# Patient Record
Sex: Female | Born: 1950 | Race: White | Hispanic: No | Marital: Married | Smoking: Light tobacco smoker
Health system: Southern US, Community
[De-identification: ages and names within clinical notes are randomized; demographics above are authoritative.]

## PROBLEM LIST (undated history)

## (undated) DIAGNOSIS — I161 Hypertensive emergency: Secondary | ICD-10-CM

## (undated) DIAGNOSIS — S62102A Fracture of unspecified carpal bone, left wrist, initial encounter for closed fracture: Secondary | ICD-10-CM

## (undated) DIAGNOSIS — I639 Cerebral infarction, unspecified: Secondary | ICD-10-CM

## (undated) DIAGNOSIS — I6381 Other cerebral infarction due to occlusion or stenosis of small artery: Secondary | ICD-10-CM

## (undated) DIAGNOSIS — I10 Essential (primary) hypertension: Secondary | ICD-10-CM

## (undated) DIAGNOSIS — E785 Hyperlipidemia, unspecified: Secondary | ICD-10-CM

## (undated) HISTORY — DX: Hyperlipidemia, unspecified: E78.5

## (undated) HISTORY — DX: Other cerebral infarction due to occlusion or stenosis of small artery: I63.81

## (undated) HISTORY — DX: Fracture of unspecified carpal bone, left wrist, initial encounter for closed fracture: S62.102A

## (undated) HISTORY — DX: Hypertensive emergency: I16.1

---
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
## 1979-05-08 HISTORY — PX: TUBAL LIGATION: SHX77

## 2018-07-04 ENCOUNTER — Encounter: Payer: Self-pay | Admitting: Emergency Medicine

## 2018-07-04 ENCOUNTER — Other Ambulatory Visit: Payer: Self-pay

## 2018-07-04 ENCOUNTER — Emergency Department
Admission: EM | Admit: 2018-07-04 | Discharge: 2018-07-04 | Disposition: A | Discharge status: Home / Self Care | Payer: Medicare Other | Attending: Emergency Medicine | Admitting: Emergency Medicine

## 2018-07-04 ENCOUNTER — Emergency Department: Payer: Medicare Other

## 2018-07-04 DIAGNOSIS — S6992XA Unspecified injury of left wrist, hand and finger(s), initial encounter: Secondary | ICD-10-CM | POA: Diagnosis present

## 2018-07-04 DIAGNOSIS — W0110XA Fall on same level from slipping, tripping and stumbling with subsequent striking against unspecified object, initial encounter: Secondary | ICD-10-CM | POA: Diagnosis not present

## 2018-07-04 DIAGNOSIS — S60511A Abrasion of right hand, initial encounter: Secondary | ICD-10-CM | POA: Diagnosis not present

## 2018-07-04 DIAGNOSIS — Y92008 Other place in unspecified non-institutional (private) residence as the place of occurrence of the external cause: Secondary | ICD-10-CM | POA: Insufficient documentation

## 2018-07-04 DIAGNOSIS — Y939 Activity, unspecified: Secondary | ICD-10-CM | POA: Diagnosis not present

## 2018-07-04 DIAGNOSIS — S52572A Other intraarticular fracture of lower end of left radius, initial encounter for closed fracture: Secondary | ICD-10-CM | POA: Insufficient documentation

## 2018-07-04 DIAGNOSIS — Y999 Unspecified external cause status: Secondary | ICD-10-CM | POA: Insufficient documentation

## 2018-07-04 DIAGNOSIS — W19XXXA Unspecified fall, initial encounter: Secondary | ICD-10-CM

## 2018-07-04 DIAGNOSIS — S0081XA Abrasion of other part of head, initial encounter: Secondary | ICD-10-CM | POA: Insufficient documentation

## 2018-07-04 MED ORDER — BACITRACIN-NEOMYCIN-POLYMYXIN 400-5-5000 EX OINT
TOPICAL_OINTMENT | Freq: Once | CUTANEOUS | Status: AC
  Administered 2018-07-04: 20:00:00 via TOPICAL
  Filled 2018-07-04: qty 1

## 2018-07-04 MED ORDER — HYDROCODONE-ACETAMINOPHEN 5-325 MG PO TABS: 1.0000 | ORAL_TABLET | Freq: Three times a day (TID) | ORAL | 0 refills | Status: AC | PRN

## 2018-07-04 MED ORDER — HYDROCODONE-ACETAMINOPHEN 5-325 MG PO TABS
1.0000 | ORAL_TABLET | Freq: Once | ORAL | Status: AC
  Administered 2018-07-04: 1 via ORAL
  Filled 2018-07-04: qty 1

## 2018-07-04 NOTE — Discharge Instructions (Addendum)
You were seen today for left wrist pain and swelling. You have a distal radius fracture. We put you in a splint. I have given you a RX for pain medication every 8 hours. Please call [facility] for follow up on Monday

## 2018-07-04 NOTE — ED Provider Notes (Signed)
[facility] Emergency Department Provider Note ____________________________________________  Time seen: 1935  I have reviewed the triage vital signs and the nursing notes.  HISTORY  Chief Complaint  Fall   HPI Jasmine [name] is a 68 y.o. female presents to the ER today with complaint of left wrist pain and swelling.  She reports approximately 1-1/2 hours ago she was walking in the driveway, tripped and fell.  She caught herself with her hands.  She did hit her head on the driveway but did not lose consciousness.  She reports abrasion to the head but is more concerned about the left wrist pain and swelling.  She denies numb numbness or tingling but has had some weakness.  She has not taking anything prior to arrival.  History reviewed. No pertinent past medical history.  There are no active problems to display for this patient.   History reviewed. No pertinent surgical history.  Prior to Admission medications   Medication Sig Start Date End Date Taking? Authorizing Provider  HYDROcodone-acetaminophen (NORCO/VICODIN) 5-325 MG tablet Take 1 tablet by mouth every 8 (eight) hours as needed for moderate pain. 07/04/18 07/04/19  Lorre Munroe, NP    Allergies Patient has no known allergies.  No family history on file.  Social History Social History   Tobacco Use  . Smoking status: Not on file  Substance Use Topics  . Alcohol use: Not on file  . Drug use: Not on file    Review of Systems  Constitutional: Negative for fever. Eyes: Negative for visual changes. Cardiovascular: Negative for chest pain or chest tightness. Respiratory: Negative for shortness of breath. Musculoskeletal: Positive for left wrist pain and swelling.  Negative for left elbow or shoulder pain. Skin: Positive for abrasion to forehead and right palm.. Neurological: Negative for headaches,, dizziness, tingling or numbness. ____________________________________________  PHYSICAL  EXAM:  VITAL SIGNS: ED Triage Vitals  Enc Vitals Group     BP 07/04/18 1852 (!) 233/104     Pulse Rate 07/04/18 1852 70     Resp 07/04/18 1852 18     Temp 07/04/18 1852 97.6 F (36.4 C)     Temp Source 07/04/18 1852 Oral     SpO2 07/04/18 1852 100 %     Weight 07/04/18 1830 108 lb (49 kg)     Height 07/04/18 1830 5\' 3"  (1.6 m)     Head Circumference --      Peak Flow --      Pain Score 07/04/18 1830 8     Pain Loc --      Pain Edu? --      Excl. in GC? --     Constitutional: Alert and oriented. Well appearing and in no distress. Head: Normocephalic.  Abrasion noted to right anterior forehead. Eyes: Conjunctivae are normal. PERRL. Normal extraocular movements Cardiovascular: Normal rate, regular rhythm.  Radial pulses 2+ bilaterally.  Cap refill less than 3 seconds. Respiratory: Normal respiratory effort. No wheezes/rales/rhonchi. Musculoskeletal: Decreased flexion, extension and rotation of the left wrist secondary to pain.  Pain with palpation over the distal radius.  1+ swelling noted of the left wrist.  Normal flexion, extension and rotation of the cervical spine.  No bony tenderness noted over the cervical spine. Neurologic:  Sensation intact to BUE. Skin:  Skin is warm, dry and intact. Abrasion noted to right palm. ____________________________________________  RADIOLOGY   Imaging Orders     DG Wrist Complete Left  IMPRESSION: Acute impacted fracture of the distal radius  with intra-articular Extension.  ____________________________________________  PROCEDURES  .Splint Application Date/Time: 07/04/2018 7:50 PM Performed by: Lorre Munroe, NP Authorized by: Lorre Munroe, NP   Consent:    Consent obtained:  Verbal   Consent given by:  Patient   Risks discussed:  Discoloration, numbness, pain and swelling   Alternatives discussed:  No treatment and delayed treatment Pre-procedure details:    Sensation:  Normal Procedure details:    Laterality:  Left    Location:  Wrist   Wrist:  L wrist   Strapping: no     Cast type:  Short arm   Splint type:  Sugar tong Post-procedure details:    Pain:  Improved   Sensation:  Normal   Patient tolerance of procedure:  Tolerated well, no immediate complications    ____________________________________________  INITIAL IMPRESSION / ASSESSMENT AND PLAN / ED COURSE  Acute Left Wrist Pain and Swelling, Abrasion of Right Hand, Abrasion of Forehead s/p Fall:  Xray c/w impacted distal radius fracture involving the joint Norco 5-325 mg PO x 1 given for pain Wounds cleansed with NS, TAB applied Left wrist placed in short arm sugar tong splint Ok for ortho follow up as outpatient- contact info provided     I reviewed the patient's prescription history over the last 12 months in the multi-state controlled substances database(s) that includes [place], [place], [place], [place], [place], [place], [place], [place], [place], [place], [place], [place], [place], and [place].  Results were notable for no controlled substances filled in the last 6 months. ____________________________________________  FINAL CLINICAL IMPRESSION(S) / ED DIAGNOSES  Final diagnoses:  Other closed intra-articular fracture of distal end of left radius, initial encounter  Fall, initial encounter  Abrasion of palm of right hand, initial encounter  Abrasion of forehead, initial encounter   Nicki Reaper, NP    Lorre Munroe, NP 07/04/18 2001    Dionne Bucy, MD 07/04/18 [phone]

## 2018-07-04 NOTE — ED Triage Notes (Signed)
C/O left wrist pain.  States tripped in the back yard and fell forward onto gravel.  Hit head, no LOC.  Abrasions to left forehead and right palm.  Swelling noted to left wrist.  + radial pulse.. Brisk capillary refill.

## 2018-07-08 DIAGNOSIS — S52502A Unspecified fracture of the lower end of left radius, initial encounter for closed fracture: Secondary | ICD-10-CM | POA: Diagnosis not present

## 2018-07-16 DIAGNOSIS — H2513 Age-related nuclear cataract, bilateral: Secondary | ICD-10-CM | POA: Diagnosis not present

## 2018-07-18 DIAGNOSIS — S52502A Unspecified fracture of the lower end of left radius, initial encounter for closed fracture: Secondary | ICD-10-CM | POA: Diagnosis not present

## 2018-07-28 DIAGNOSIS — S52502A Unspecified fracture of the lower end of left radius, initial encounter for closed fracture: Secondary | ICD-10-CM | POA: Diagnosis not present

## 2018-08-18 DIAGNOSIS — S52502A Unspecified fracture of the lower end of left radius, initial encounter for closed fracture: Secondary | ICD-10-CM | POA: Diagnosis not present

## 2018-08-18 DIAGNOSIS — M79632 Pain in left forearm: Secondary | ICD-10-CM | POA: Diagnosis not present

## 2018-09-08 DIAGNOSIS — S52502A Unspecified fracture of the lower end of left radius, initial encounter for closed fracture: Secondary | ICD-10-CM | POA: Diagnosis not present

## 2019-07-17 ENCOUNTER — Other Ambulatory Visit: Payer: Self-pay

## 2019-07-17 ENCOUNTER — Emergency Department (HOSPITAL_COMMUNITY): Payer: Medicare Other

## 2019-07-17 ENCOUNTER — Inpatient Hospital Stay (HOSPITAL_COMMUNITY): Payer: Medicare Other

## 2019-07-17 ENCOUNTER — Encounter (HOSPITAL_COMMUNITY): Payer: Self-pay | Admitting: Emergency Medicine

## 2019-07-17 ENCOUNTER — Inpatient Hospital Stay (HOSPITAL_COMMUNITY)
Admission: EM | Admit: 2019-07-17 | Discharge: 2019-07-21 | DRG: 305 | Disposition: A | Discharge status: Home / Self Care | Payer: Medicare Other | Attending: Pulmonary Disease | Admitting: Pulmonary Disease

## 2019-07-17 DIAGNOSIS — Z79899 Other long term (current) drug therapy: Secondary | ICD-10-CM | POA: Diagnosis not present

## 2019-07-17 DIAGNOSIS — Z9114 Patient's other noncompliance with medication regimen: Secondary | ICD-10-CM | POA: Diagnosis not present

## 2019-07-17 DIAGNOSIS — E876 Hypokalemia: Secondary | ICD-10-CM | POA: Diagnosis present

## 2019-07-17 DIAGNOSIS — R531 Weakness: Secondary | ICD-10-CM | POA: Diagnosis not present

## 2019-07-17 DIAGNOSIS — Z743 Need for continuous supervision: Secondary | ICD-10-CM | POA: Diagnosis not present

## 2019-07-17 DIAGNOSIS — Z7982 Long term (current) use of aspirin: Secondary | ICD-10-CM | POA: Diagnosis not present

## 2019-07-17 DIAGNOSIS — I6381 Other cerebral infarction due to occlusion or stenosis of small artery: Secondary | ICD-10-CM

## 2019-07-17 DIAGNOSIS — Z8673 Personal history of transient ischemic attack (TIA), and cerebral infarction without residual deficits: Secondary | ICD-10-CM

## 2019-07-17 DIAGNOSIS — R0902 Hypoxemia: Secondary | ICD-10-CM | POA: Diagnosis not present

## 2019-07-17 DIAGNOSIS — Z791 Long term (current) use of non-steroidal anti-inflammatories (NSAID): Secondary | ICD-10-CM

## 2019-07-17 DIAGNOSIS — E7800 Pure hypercholesterolemia, unspecified: Secondary | ICD-10-CM | POA: Diagnosis not present

## 2019-07-17 DIAGNOSIS — I161 Hypertensive emergency: Principal | ICD-10-CM | POA: Diagnosis present

## 2019-07-17 DIAGNOSIS — I674 Hypertensive encephalopathy: Secondary | ICD-10-CM | POA: Diagnosis present

## 2019-07-17 DIAGNOSIS — I6389 Other cerebral infarction: Secondary | ICD-10-CM | POA: Diagnosis not present

## 2019-07-17 DIAGNOSIS — F419 Anxiety disorder, unspecified: Secondary | ICD-10-CM | POA: Diagnosis present

## 2019-07-17 DIAGNOSIS — E785 Hyperlipidemia, unspecified: Secondary | ICD-10-CM | POA: Diagnosis present

## 2019-07-17 DIAGNOSIS — I10 Essential (primary) hypertension: Secondary | ICD-10-CM | POA: Diagnosis present

## 2019-07-17 DIAGNOSIS — Z20822 Contact with and (suspected) exposure to covid-19: Secondary | ICD-10-CM | POA: Diagnosis not present

## 2019-07-17 DIAGNOSIS — I639 Cerebral infarction, unspecified: Secondary | ICD-10-CM | POA: Diagnosis not present

## 2019-07-17 DIAGNOSIS — I6523 Occlusion and stenosis of bilateral carotid arteries: Secondary | ICD-10-CM | POA: Diagnosis not present

## 2019-07-17 DIAGNOSIS — F17200 Nicotine dependence, unspecified, uncomplicated: Secondary | ICD-10-CM | POA: Diagnosis not present

## 2019-07-17 DIAGNOSIS — R29810 Facial weakness: Secondary | ICD-10-CM | POA: Diagnosis not present

## 2019-07-17 DIAGNOSIS — F17210 Nicotine dependence, cigarettes, uncomplicated: Secondary | ICD-10-CM | POA: Diagnosis present

## 2019-07-17 HISTORY — DX: Essential (primary) hypertension: I10

## 2019-07-17 LAB — RESPIRATORY PANEL BY RT PCR (FLU A&B, COVID)
Influenza A by PCR: NEGATIVE
Influenza B by PCR: NEGATIVE
SARS Coronavirus 2 by RT PCR: NEGATIVE

## 2019-07-17 LAB — CBC WITH DIFFERENTIAL/PLATELET
Abs Immature Granulocytes: 0.04 10*3/uL (ref 0.00–0.07)
Basophils Absolute: 0.1 10*3/uL (ref 0.0–0.1)
Basophils Relative: 1 %
Eosinophils Absolute: 0.1 10*3/uL (ref 0.0–0.5)
Eosinophils Relative: 1 %
HCT: 44.9 % (ref 36.0–46.0)
Hemoglobin: 14.7 g/dL (ref 12.0–15.0)
Immature Granulocytes: 0 %
Lymphocytes Relative: 26 %
Lymphs Abs: 2.7 10*3/uL (ref 0.7–4.0)
MCH: 29.3 pg (ref 26.0–34.0)
MCHC: 32.7 g/dL (ref 30.0–36.0)
MCV: 89.4 fL (ref 80.0–100.0)
Monocytes Absolute: 0.7 10*3/uL (ref 0.1–1.0)
Monocytes Relative: 7 %
Neutro Abs: 7 10*3/uL (ref 1.7–7.7)
Neutrophils Relative %: 65 %
Platelets: 261 10*3/uL (ref 150–400)
RBC: 5.02 MIL/uL (ref 3.87–5.11)
RDW: 12.6 % (ref 11.5–15.5)
WBC: 10.7 10*3/uL — ABNORMAL HIGH (ref 4.0–10.5)
nRBC: 0 % (ref 0.0–0.2)

## 2019-07-17 LAB — BASIC METABOLIC PANEL
Anion gap: 9 (ref 5–15)
BUN: <5 mg/dL — ABNORMAL LOW (ref 8–23)
CO2: 26 mmol/L (ref 22–32)
Calcium: 9.7 mg/dL (ref 8.9–10.3)
Chloride: 108 mmol/L (ref 98–111)
Creatinine, Ser: 0.64 mg/dL (ref 0.44–1.00)
GFR calc Af Amer: >60 mL/min (ref >60)
GFR calc non Af Amer: >60 mL/min (ref >60)
Glucose, Bld: 83 mg/dL (ref 70–99)
Potassium: 3.5 mmol/L (ref 3.5–5.1)
Sodium: 143 mmol/L (ref 135–145)

## 2019-07-17 LAB — URINALYSIS, ROUTINE W REFLEX MICROSCOPIC
Bilirubin Urine: NEGATIVE
Glucose, UA: NEGATIVE mg/dL
Hgb urine dipstick: NEGATIVE
Ketones, ur: NEGATIVE mg/dL
Leukocytes,Ua: NEGATIVE
Nitrite: NEGATIVE
Protein, ur: NEGATIVE mg/dL
Specific Gravity, Urine: 1.001 — ABNORMAL LOW (ref 1.005–1.030)
pH: 7 (ref 5.0–8.0)

## 2019-07-17 LAB — I-STAT CHEM 8, ED
BUN: 4 mg/dL — ABNORMAL LOW (ref 8–23)
Calcium, Ion: 1.27 mmol/L (ref 1.15–1.40)
Chloride: 107 mmol/L (ref 98–111)
Creatinine, Ser: 0.6 mg/dL (ref 0.44–1.00)
Glucose, Bld: 78 mg/dL (ref 70–99)
HCT: 41 % (ref 36.0–46.0)
Hemoglobin: 13.9 g/dL (ref 12.0–15.0)
Potassium: 3.4 mmol/L — ABNORMAL LOW (ref 3.5–5.1)
Sodium: 143 mmol/L (ref 135–145)
TCO2: 28 mmol/L (ref 22–32)

## 2019-07-17 LAB — RAPID URINE DRUG SCREEN, HOSP PERFORMED
Amphetamines: NOT DETECTED
Barbiturates: NOT DETECTED
Benzodiazepines: POSITIVE — AB
Cocaine: NOT DETECTED
Opiates: NOT DETECTED
Tetrahydrocannabinol: NOT DETECTED

## 2019-07-17 LAB — MRSA PCR SCREENING: MRSA by PCR: NEGATIVE

## 2019-07-17 LAB — ETHANOL: Alcohol, Ethyl (B): <10 mg/dL (ref <10)

## 2019-07-17 LAB — HIV ANTIBODY (ROUTINE TESTING W REFLEX): HIV Screen 4th Generation wRfx: NONREACTIVE

## 2019-07-17 LAB — APTT: aPTT: 28 seconds (ref 24–36)

## 2019-07-17 LAB — PROTIME-INR
INR: 0.9 (ref 0.8–1.2)
Prothrombin Time: 12.5 seconds (ref 11.4–15.2)

## 2019-07-17 LAB — BRAIN NATRIURETIC PEPTIDE: B Natriuretic Peptide: 140.7 pg/mL — ABNORMAL HIGH (ref 0.0–100.0)

## 2019-07-17 LAB — CBG MONITORING, ED: Glucose-Capillary: 88 mg/dL (ref 70–99)

## 2019-07-17 MED ORDER — HYDRALAZINE HCL 50 MG PO TABS
50.0000 mg | ORAL_TABLET | Freq: Four times a day (QID) | ORAL | Status: DC | PRN
  Administered 2019-07-17: 50 mg via ORAL
  Filled 2019-07-17: qty 1

## 2019-07-17 MED ORDER — IOHEXOL 350 MG/ML SOLN
75.0000 mL | Freq: Once | INTRAVENOUS | Status: AC | PRN
  Administered 2019-07-17: 75 mL via INTRAVENOUS

## 2019-07-17 MED ORDER — LABETALOL HCL 5 MG/ML IV SOLN
20.0000 mg | Freq: Once | INTRAVENOUS | Status: AC
  Administered 2019-07-17: 20 mg via INTRAVENOUS
  Filled 2019-07-17: qty 4

## 2019-07-17 MED ORDER — CHLORHEXIDINE GLUCONATE CLOTH 2 % EX PADS
6.0000 | MEDICATED_PAD | Freq: Every day | CUTANEOUS | Status: DC
  Administered 2019-07-17 – 2019-07-20 (×4): 6 via TOPICAL

## 2019-07-17 MED ORDER — VITAMIN D 25 MCG (1000 UNIT) PO TABS
1000.0000 [IU] | ORAL_TABLET | Freq: Every day | ORAL | Status: DC
  Administered 2019-07-18 – 2019-07-21 (×4): 1000 [IU] via ORAL
  Filled 2019-07-17 (×4): qty 1

## 2019-07-17 MED ORDER — SODIUM CHLORIDE 0.9% FLUSH: 3.0000 mL | Freq: Once | INTRAVENOUS | Status: DC

## 2019-07-17 MED ORDER — POTASSIUM CHLORIDE CRYS ER 20 MEQ PO TBCR: 40.0000 meq | EXTENDED_RELEASE_TABLET | Freq: Once | ORAL | Status: DC

## 2019-07-17 MED ORDER — HEPARIN SODIUM (PORCINE) 5000 UNIT/ML IJ SOLN
5000.0000 [IU] | Freq: Three times a day (TID) | INTRAMUSCULAR | Status: DC
  Administered 2019-07-18 – 2019-07-20 (×9): 5000 [IU] via SUBCUTANEOUS
  Filled 2019-07-17 (×11): qty 1

## 2019-07-17 MED ORDER — SODIUM CHLORIDE 0.9 % IV SOLN: INTRAVENOUS | Status: DC

## 2019-07-17 MED ORDER — CLEVIDIPINE BUTYRATE 0.5 MG/ML IV EMUL
0.0000 mg/h | INTRAVENOUS | Status: DC
  Administered 2019-07-17: 2 mg/h via INTRAVENOUS
  Administered 2019-07-18: 15 mg/h via INTRAVENOUS
  Administered 2019-07-18: 8 mg/h via INTRAVENOUS
  Administered 2019-07-18: 12 mg/h via INTRAVENOUS
  Administered 2019-07-19: 8 mg/h via INTRAVENOUS
  Administered 2019-07-19: 10 mg/h via INTRAVENOUS
  Filled 2019-07-17 (×10): qty 50

## 2019-07-17 MED ORDER — CLEVIDIPINE BUTYRATE 0.5 MG/ML IV EMUL
0.0000 mg/h | INTRAVENOUS | Status: DC
  Administered 2019-07-17: 1 mg/h via INTRAVENOUS
  Filled 2019-07-17: qty 50

## 2019-07-17 NOTE — Consult Note (Signed)
NEURO HOSPITALIST CONSULT NOTE   Requestig physician: Dr. Rubin Payor  Reason for Consult: Acute onset of stuttering TIA symptoms  History obtained from:  Patient and Chart     HPI:                                                                                                                                          Jasmine [name] is a 69 y.o. female with a history of HTN presenting to the ED after waking up at 7:30 AM with LUE and LLE weakness. She took a full dose of ASA and the symptoms resolved by 8 AM. The patient then had another spell of the same symptoms lasting from 10 to 11 AM; she also noticed difficulty picking up her phone at that time. She was able to call EMS and was transported to the ED at [facility], arriving at about 11:30 AM. Had endorsed numbness of the left leg below the knee with EMS. When EDP examined the patient, her BP was noted to be 230/108; at that time (11:55 AM) symptoms consistent with a third spell occurred and Code Stroke was called. The symptoms rapidly resolved by 12:05 PM. She was taken directly to CT and a CTA head/neck was also obtained. No hemorrhage or acute hypodensity was seen on CT. No LVO was appreciated on CTA. When she was brought back to her room, BP continued to be elevated at 229/89.    Past Medical History:  Diagnosis Date  . Hypertension     Past Surgical History:  Procedure Laterality Date  . TUBAL LIGATION  1981    No family history on file.            Social History:  reports that she has been smoking cigarettes. She has been smoking about 1.00 pack per day. She has never used smokeless tobacco. She reports previous alcohol use. She reports previous drug use.  No Known Allergies  HOME MEDICATIONS:                                                                                                                      No current facility-administered medications on file prior to encounter.   Current Outpatient  Medications on File Prior to Encounter  Medication Sig Dispense  Refill  . aspirin EC 325 MG tablet Take 325 mg by mouth once.    . Black Elderberry 50 MG/5ML SYRP Take 15 mLs by mouth daily.    . cholecalciferol (VITAMIN D3) 25 MCG (1000 UNIT) tablet Take 1,000 Units by mouth daily.    Marland Kitchen. ibuprofen (ADVIL) 200 MG tablet Take 400 mg by mouth every 6 (six) hours as needed for headache or moderate pain.    . Multiple Vitamins-Minerals (MULTIVITAMIN WITH MINERALS) tablet Take 1 tablet by mouth daily.    . Probiotic Product (PROBIOTIC-10 ULTIMATE) CAPS Take 1 capsule by mouth daily.      ROS:                                                                                                                                       No SOB, CP, abdominal pain, abnormal sweating, back pain, headache, speech deficit or confusion. Does not endorse B/B incontinence, seizure activity or vision changes.   Blood pressure (!) 211/97, pulse 67, temperature 98 F (36.7 C), temperature source Oral, resp. rate 20, height 5\' 1"  (1.549 m), weight 48.6 kg, SpO2 98 %.   General Examination:                                                                                                       Physical Exam  HEENT-  [place]/AT    Lungs- Respirations unlabored Extremities- No edema  Neurological Examination Mental Status: Alert, fully oriented, thought content appropriate.  Speech fluent without evidence of aphasia.  Able to follow all commands without difficulty. Cranial Nerves: II: Visual fields intact with no extinction to DSS. Fixates and tracks normally.   III,IV, VI: EOMI without nystagmus. No ptosis.  V,VII: Smile symmetric, facial temp sensation normal bilaterally VIII: hearing intact to voice IX,X: No hypophonia or hoarseness XI: Symmetric XII: Midline tongue extension Motor: 4+/5 left hip flexion. Otherwise 5/5 in all 4 extremities.  Normal tone throughout; no atrophy noted Sensory: Temp and light touch  intact throughout, bilaterally. No extinction to DSS.  Deep Tendon Reflexes:  3+ bilateral brachioradialis 2+ right biceps, 3+ left biceps 3+ right patellar, 4+ left patellar (crossed adductor response) 2+ right achilles, 3+ left achilles Right toe downgoing, left toe upgoing.  Cerebellar: Action tremor with LUE FNF, otherwise normal.  Gait: Deferred   Lab Results: Basic Metabolic Panel: Recent Labs  Lab 07/17/19 1204  NA 143  K 3.4*  CL 107  GLUCOSE 78  BUN 4*  CREATININE 0.60    CBC: Recent Labs  Lab 07/17/19 1141 07/17/19 1204  WBC 10.7*  --   NEUTROABS 7.0  --   HGB 14.7 13.9  HCT 44.9 41.0  MCV 89.4  --   PLT 261  --     Cardiac Enzymes: No results for input(s): CKTOTAL, CKMB, CKMBINDEX, TROPONINI in the last 168 hours.  Lipid Panel: No results for input(s): CHOL, TRIG, HDL, CHOLHDL, VLDL, LDLCALC in the last 168 hours.  Imaging: CT Angio Head W or Wo Contrast  Result Date: 07/17/2019 CLINICAL DATA:  Intermittent left-sided numbness in weakness beginning 0730 hours EXAM: CT ANGIOGRAPHY HEAD AND NECK TECHNIQUE: Multidetector CT imaging of the head and neck was performed using the standard protocol during bolus administration of intravenous contrast. Multiplanar CT image reconstructions and MIPs were obtained to evaluate the vascular anatomy. Carotid stenosis measurements (when applicable) are obtained utilizing NASCET criteria, using the distal internal carotid diameter as the denominator. CONTRAST:  58mL OMNIPAQUE IOHEXOL 350 MG/ML SOLN COMPARISON:  None. FINDINGS: CT HEAD FINDINGS Brain: The brain shows a normal appearance without evidence of malformation, atrophy, acute small or large vessel infarction, mass lesion, hemorrhage, hydrocephalus or extra-axial collection. Mild chronic small-vessel change of the white matter. 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 Vascular: No hyperdense vessel. No evidence of  atherosclerotic calcification. Skull: Normal.  No traumatic finding.  No focal bone lesion. Sinuses/Orbits: Sinuses are clear. Orbits appear normal. Mastoids are clear. Other: None significant Aspects score: 10 CTA NECK FINDINGS Aortic arch: Aortic atherosclerosis. Branching pattern is normal without origin stenosis. Right carotid system: Common carotid artery widely patent to the bifurcation. Scattered plaque formation but no stenosis. Carotid bifurcation shows soft and calcified plaque but there is no stenosis compared to the more distal cervical ICA diameter. Left carotid system: Common carotid artery shows scattered plaque but is widely patent to the bifurcation. Soft and calcified plaque at the carotid bifurcation and ICA bulb but no stenosis compared to the more distal cervical ICA diameter. Vertebral arteries: Early origin of the right vertebral artery. Widely patent. Left vertebral artery is normal. Skeleton: Ordinary cervical spondylosis. Other neck: No mass or lymphadenopathy. Upper chest: Emphysema and pulmonary scarring.  No active process. Review of the MIP images confirms the above findings CTA HEAD FINDINGS Anterior circulation: Both internal carotid arteries widely patent through the skull base and siphon region. Ordinary siphon atherosclerotic calcification but no stenosis greater than 30%. The anterior and middle cerebral vessels are patent without proximal stenosis, aneurysm or vascular malformation. No large or medium vessel occlusion identified. Posterior circulation: Both vertebral arteries widely patent to the basilar. No basilar stenosis. Posterior circulation branch vessels are normal. Venous sinuses: Patent and normal. Anatomic variants: None significant. Review of the MIP images confirms the above findings IMPRESSION: Head CT: No acute finding. Aspects 10. 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  CT angiography of the neck: Aortic atherosclerosis and emphysema. Atherosclerotic plaque affecting both common carotid arteries but without measurable stenosis. Soft and calcified plaque at both carotid bifurcations and ICA bulbs but without measurable stenosis. No posterior circulation pathology. CT angiography of the head: No intracranial large or medium vessel occlusion. No correctable proximal stenosis. These results were communicated to Dr. Cheral Marker at 12:33 pmon 3/12/2021by text page via the [facility] messaging system. Electronically Signed   By: Nelson Chimes M.D.   On: 07/17/2019 12:36  CT Angio Neck W and/or Wo Contrast  Result Date: 07/17/2019 CLINICAL DATA:  Intermittent left-sided numbness in weakness beginning 0730 hours EXAM: CT ANGIOGRAPHY HEAD AND NECK TECHNIQUE: Multidetector CT imaging of the head and neck was performed using the standard protocol during bolus administration of intravenous contrast. Multiplanar CT image reconstructions and MIPs were obtained to evaluate the vascular anatomy. Carotid stenosis measurements (when applicable) are obtained utilizing NASCET criteria, using the distal internal carotid diameter as the denominator. CONTRAST:  64mL OMNIPAQUE IOHEXOL 350 MG/ML SOLN COMPARISON:  None. FINDINGS: CT HEAD FINDINGS Brain: The brain shows a normal appearance without evidence of malformation, atrophy, acute small or large vessel infarction, mass lesion, hemorrhage, hydrocephalus or extra-axial collection. Mild chronic small-vessel change of the white matter. 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 Vascular: No hyperdense vessel. No evidence of atherosclerotic calcification. Skull: Normal.  No traumatic finding.  No focal bone lesion. Sinuses/Orbits: Sinuses are clear. Orbits appear normal. Mastoids are clear. Other: None significant Aspects score: 10 CTA NECK FINDINGS Aortic arch: Aortic atherosclerosis. Branching pattern is normal  without origin stenosis. Right carotid system: Common carotid artery widely patent to the bifurcation. Scattered plaque formation but no stenosis. Carotid bifurcation shows soft and calcified plaque but there is no stenosis compared to the more distal cervical ICA diameter. Left carotid system: Common carotid artery shows scattered plaque but is widely patent to the bifurcation. Soft and calcified plaque at the carotid bifurcation and ICA bulb but no stenosis compared to the more distal cervical ICA diameter. Vertebral arteries: Early origin of the right vertebral artery. Widely patent. Left vertebral artery is normal. Skeleton: Ordinary cervical spondylosis. Other neck: No mass or lymphadenopathy. Upper chest: Emphysema and pulmonary scarring.  No active process. Review of the MIP images confirms the above findings CTA HEAD FINDINGS Anterior circulation: Both internal carotid arteries widely patent through the skull base and siphon region. Ordinary siphon atherosclerotic calcification but no stenosis greater than 30%. The anterior and middle cerebral vessels are patent without proximal stenosis, aneurysm or vascular malformation. No large or medium vessel occlusion identified. Posterior circulation: Both vertebral arteries widely patent to the basilar. No basilar stenosis. Posterior circulation branch vessels are normal. Venous sinuses: Patent and normal. Anatomic variants: None significant. Review of the MIP images confirms the above findings IMPRESSION: Head CT: No acute finding. Aspects 10. 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 CT angiography of the neck: Aortic atherosclerosis and emphysema. Atherosclerotic plaque affecting both common carotid arteries but without measurable stenosis. Soft and calcified plaque at both carotid bifurcations and ICA bulbs but without measurable stenosis. No posterior circulation  pathology. CT angiography of the head: No intracranial large or medium vessel occlusion. No correctable proximal stenosis. These results were communicated to Dr. Otelia Limes at 12:33 pmon 3/12/2021by text page via the [facility] messaging system. Electronically Signed   By: Paulina Fusi M.D.   On: 07/17/2019 12:36   CT HEAD CODE STROKE WO CONTRAST  Result Date: 07/17/2019 CLINICAL DATA:  Intermittent left-sided numbness in weakness beginning 0730 hours EXAM: CT ANGIOGRAPHY HEAD AND NECK TECHNIQUE: Multidetector CT imaging of the head and neck was performed using the standard protocol during bolus administration of intravenous contrast. Multiplanar CT image reconstructions and MIPs were obtained to evaluate the vascular anatomy. Carotid stenosis measurements (when applicable) are obtained utilizing NASCET criteria, using the distal internal carotid diameter as the denominator. CONTRAST:  74mL OMNIPAQUE IOHEXOL 350 MG/ML SOLN COMPARISON:  None. FINDINGS: CT HEAD FINDINGS Brain: The brain shows a normal appearance without evidence of malformation, atrophy, acute small or large vessel infarction, mass lesion, hemorrhage, hydrocephalus or extra-axial collection. Mild chronic small-vessel change of the white matter. 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 Vascular: No hyperdense vessel. No evidence of atherosclerotic calcification. Skull: Normal.  No traumatic finding.  No focal bone lesion. Sinuses/Orbits: Sinuses are clear. Orbits appear normal. Mastoids are clear. Other: None significant Aspects score: 10 CTA NECK FINDINGS Aortic arch: Aortic atherosclerosis. Branching pattern is normal without origin stenosis. Right carotid system: Common carotid artery widely patent to the bifurcation. Scattered plaque formation but no stenosis. Carotid bifurcation shows soft and calcified plaque but there is no stenosis compared to the more distal cervical ICA diameter. Left carotid system:  Common carotid artery shows scattered plaque but is widely patent to the bifurcation. Soft and calcified plaque at the carotid bifurcation and ICA bulb but no stenosis compared to the more distal cervical ICA diameter. Vertebral arteries: Early origin of the right vertebral artery. Widely patent. Left vertebral artery is normal. Skeleton: Ordinary cervical spondylosis. Other neck: No mass or lymphadenopathy. Upper chest: Emphysema and pulmonary scarring.  No active process. Review of the MIP images confirms the above findings CTA HEAD FINDINGS Anterior circulation: Both internal carotid arteries widely patent through the skull base and siphon region. Ordinary siphon atherosclerotic calcification but no stenosis greater than 30%. The anterior and middle cerebral vessels are patent without proximal stenosis, aneurysm or vascular malformation. No large or medium vessel occlusion identified. Posterior circulation: Both vertebral arteries widely patent to the basilar. No basilar stenosis. Posterior circulation branch vessels are normal. Venous sinuses: Patent and normal. Anatomic variants: None significant. Review of the MIP images confirms the above findings IMPRESSION: Head CT: No acute finding. Aspects 10. 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 CT angiography of the neck: Aortic atherosclerosis and emphysema. Atherosclerotic plaque affecting both common carotid arteries but without measurable stenosis. Soft and calcified plaque at both carotid bifurcations and ICA bulbs but without measurable stenosis. No posterior circulation pathology. CT angiography of the head: No intracranial large or medium vessel occlusion. No correctable proximal stenosis. These results were communicated to Dr. Otelia Limes at 12:33 pmon 3/12/2021by text page via the [facility] messaging system. Electronically Signed   By: Paulina Fusi M.D.   On: 07/17/2019  12:36    Assessment: 69 year old female presenting with stuttering TIA symptoms manifesting as intermittent left sided weakness.  1. Neurological exam reveals subtle left hip flexion weakness and asymmetric reflexes, greater on the left.   2. CT head is negative for acute hemorrhage. There are old small vessel infarctions in the left basal ganglia/external capsule and mild small vessel ischemic changes of the hemispheric white matter.  3. No LVO on CTA of head and neck. Mild atherosclerosis is noted.  4. Overall presentation is most consistent with hypertensive encephalopathy with associated vasospasm resulting in TIA symptoms.    Recommendations:  1. Aggressive BP management. Goal SBP of 130-150 2. MRI brain.  3. Frequent neuro checks .  Electronically signed: Dr. Caryl Pina 07/17/2019, 12:42 PM

## 2019-07-17 NOTE — Code Documentation (Signed)
69 yo female coming from home where she reports waking up with left sided heaviness and numbess. Pt took an aspirin and at approximatel [phone] the issue completely resolved. Pt reports being completely back to baseline. At 1000, the symptoms returned and the pt stated her husband called EMS. EMS evaluated the patient and encourage her to come to the hospital. Before transport to the hospital, symptoms fully resolved.   When EDP examined patient, BP noted to be 230/108 and heaviness on the left side returned. Code Stroke called at 1158. When this RN arrived at 1200, the patient was back to baseline - NIHSS 0. Taken directly to CT. CTA completed. Not a candidate for tPA due to symptoms resolved. No large vessel occlusion found on CTA.   Taken back to room and placed on the cardiac monitor. Repeat BP 229/89. No symptoms at this time. NIHSS 0. Handoff given to [place], Charity fundraiser. TIA alert. Q2 Hour VS and mNIHSS.

## 2019-07-17 NOTE — Progress Notes (Signed)
eLink Physician-Brief Progress Note Patient Name: Jasmine [name] DOB: [birth year] MRN: [id number]   Date of Service  07/17/2019  HPI/Events of Note  Cleviprex IV infiltrated. Not able to get another IV. Request for PO antihypertensive.   eICU Interventions  Will order: 1. Hydralazine 50 mg PO Q 6 hours PRN SBP > 160.     Intervention Category Major Interventions: Hypertension - evaluation and management  Ahyana Skillin Eugene 07/17/2019, 11:10 PM

## 2019-07-17 NOTE — ED Notes (Signed)
ED Provider at bedside. 

## 2019-07-17 NOTE — ED Notes (Signed)
Dr. Delton Coombes ED Provider at bedside.

## 2019-07-17 NOTE — ED Provider Notes (Signed)
[facility] EMERGENCY DEPARTMENT Provider Note   CSN: [id number] Arrival date & time: 07/17/19  1123     History Chief Complaint  Patient presents with  . Weakness    Jasmine [name] is a 69 y.o. female.  HPI Patient presents with difficulty moving the left side.  Last normal at 1130 last night.Woke up with weakness on the left side.  Had difficulty getting out of bed.  Took a full dose aspirin and states that about half hour later her left arm and left leg went back to normal.  At around 10 AM deficits returned.  In between this she had been driving and states that deficits had completely resolved.  Around 30 minutes had resolution of symptoms.  No headache.  History of hypertension but not currently on any medications.     Past Medical History:  Diagnosis Date  . Hypertension     There are no problems to display for this patient.   Past Surgical History:  Procedure Laterality Date  . TUBAL LIGATION  1981     OB History   No obstetric history on file.     No family history on file.  Social History   Tobacco Use  . Smoking status: Current Every Day Smoker    Packs/day: 1.00    Types: Cigarettes  . Smokeless tobacco: Never Used  Substance Use Topics  . Alcohol use: Not Currently  . Drug use: Not Currently    Home Medications Prior to Admission medications   Not on File    Allergies    Patient has no known allergies.  Review of Systems   Review of Systems  Constitutional: Negative for diaphoresis.  HENT: Negative for congestion.   Respiratory: Negative for shortness of breath.   Gastrointestinal: Negative for abdominal pain.  Genitourinary: Negative for flank pain.  Musculoskeletal: Negative for back pain.  Neurological: Positive for weakness. Negative for headaches.  Psychiatric/Behavioral: Negative for confusion.    Physical Exam Updated Vital Signs BP (!) 211/97 (BP Location: Right Arm)   Pulse 67   Temp 98 F (36.7 C)  (Oral)   Resp 20   Ht 5\' 1"  (1.549 m)   Wt 48.6 kg   SpO2 98%   BMI 20.24 kg/m   Physical Exam Vitals and nursing note reviewed.  HENT:     Head: Normocephalic.  Eyes:     Extraocular Movements: Extraocular movements intact.     Pupils: Pupils are equal, round, and reactive to light.  Cardiovascular:     Rate and Rhythm: Regular rhythm.  Pulmonary:     Breath sounds: No wheezing or rhonchi.  Abdominal:     Tenderness: There is no abdominal tenderness.  Musculoskeletal:        General: No tenderness.     Cervical back: Neck supple.  Skin:    General: Skin is warm.  Neurological:     Mental Status: She is alert.     Comments: Face symmetric.  Eye movements intact.  Visual fields grossly intact.  However some weakness on left upper and lower extremity compared to right.  Mild.  No left-sided neglect.  Face symmetric.  Psychiatric:        Mood and Affect: Mood normal.     ED Results / Procedures / Treatments   Labs (all labs ordered are listed, but only abnormal results are displayed) Labs Reviewed  BASIC METABOLIC PANEL - Abnormal; Notable for the following components:      Result  Value   BUN <5 (*)    All other components within normal limits  URINALYSIS, ROUTINE W REFLEX MICROSCOPIC - Abnormal; Notable for the following components:   Color, Urine COLORLESS (*)    Specific Gravity, Urine 1.001 (*)    All other components within normal limits  CBC WITH DIFFERENTIAL/PLATELET - Abnormal; Notable for the following components:   WBC 10.7 (*)    All other components within normal limits  I-STAT CHEM 8, ED - Abnormal; Notable for the following components:   Potassium 3.4 (*)    BUN 4 (*)    All other components within normal limits  RESPIRATORY PANEL BY RT PCR (FLU A&B, COVID)  PROTIME-INR  APTT  ETHANOL  RAPID URINE DRUG SCREEN, HOSP PERFORMED  CBG MONITORING, ED    EKG EKG Interpretation  Date/Time:  Friday July 17 2019 11:34:59 EST Ventricular Rate:  72 PR  Interval:    QRS Duration: 90 QT Interval:  413 QTC Calculation: 452 R Axis:   85 Text Interpretation: Sinus rhythm Ventricular premature complex Borderline right axis deviation Left ventricular hypertrophy Abnormal T, consider ischemia, lateral leads Confirmed by Benjiman CorePickering, Kaylei Frink [phone]([id number]) on 07/17/2019 12:53:50 PM   Radiology CT Angio Head W or Wo Contrast  Result Date: 07/17/2019 CLINICAL DATA:  Intermittent left-sided numbness in weakness beginning 0730 hours EXAM: CT ANGIOGRAPHY HEAD AND NECK TECHNIQUE: Multidetector CT imaging of the head and neck was performed using the standard protocol during bolus administration of intravenous contrast. Multiplanar CT image reconstructions and MIPs were obtained to evaluate the vascular anatomy. Carotid stenosis measurements (when applicable) are obtained utilizing NASCET criteria, using the distal internal carotid diameter as the denominator. CONTRAST:  75mL OMNIPAQUE IOHEXOL 350 MG/ML SOLN COMPARISON:  None. FINDINGS: CT HEAD FINDINGS Brain: The brain shows a normal appearance without evidence of malformation, atrophy, acute small or large vessel infarction, mass lesion, hemorrhage, hydrocephalus or extra-axial collection. Mild chronic small-vessel change of the white matter. 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 Vascular: No hyperdense vessel. No evidence of atherosclerotic calcification. Skull: Normal.  No traumatic finding.  No focal bone lesion. Sinuses/Orbits: Sinuses are clear. Orbits appear normal. Mastoids are clear. Other: None significant Aspects score: 10 CTA NECK FINDINGS Aortic arch: Aortic atherosclerosis. Branching pattern is normal without origin stenosis. Right carotid system: Common carotid artery widely patent to the bifurcation. Scattered plaque formation but no stenosis. Carotid bifurcation shows soft and calcified plaque but there is no stenosis compared to the more distal cervical ICA  diameter. Left carotid system: Common carotid artery shows scattered plaque but is widely patent to the bifurcation. Soft and calcified plaque at the carotid bifurcation and ICA bulb but no stenosis compared to the more distal cervical ICA diameter. Vertebral arteries: Early origin of the right vertebral artery. Widely patent. Left vertebral artery is normal. Skeleton: Ordinary cervical spondylosis. Other neck: No mass or lymphadenopathy. Upper chest: Emphysema and pulmonary scarring.  No active process. Review of the MIP images confirms the above findings CTA HEAD FINDINGS Anterior circulation: Both internal carotid arteries widely patent through the skull base and siphon region. Ordinary siphon atherosclerotic calcification but no stenosis greater than 30%. The anterior and middle cerebral vessels are patent without proximal stenosis, aneurysm or vascular malformation. No large or medium vessel occlusion identified. Posterior circulation: Both vertebral arteries widely patent to the basilar. No basilar stenosis. Posterior circulation branch vessels are normal. Venous sinuses: Patent and normal. Anatomic variants: None significant. Review of the  MIP images confirms the above findings IMPRESSION: Head CT: No acute finding. Aspects 10. 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 CT angiography of the neck: Aortic atherosclerosis and emphysema. Atherosclerotic plaque affecting both common carotid arteries but without measurable stenosis. Soft and calcified plaque at both carotid bifurcations and ICA bulbs but without measurable stenosis. No posterior circulation pathology. CT angiography of the head: No intracranial large or medium vessel occlusion. No correctable proximal stenosis. These results were communicated to Dr. Cheral Marker at 12:33 pmon 3/12/2021by text page via the [facility] messaging system. Electronically Signed   By: Nelson Chimes M.D.   On: 07/17/2019 12:36   CT Angio Neck W and/or Wo Contrast  Result Date: 07/17/2019 CLINICAL DATA:  Intermittent left-sided numbness in weakness beginning 0730 hours EXAM: CT ANGIOGRAPHY HEAD AND NECK TECHNIQUE: Multidetector CT imaging of the head and neck was performed using the standard protocol during bolus administration of intravenous contrast. Multiplanar CT image reconstructions and MIPs were obtained to evaluate the vascular anatomy. Carotid stenosis measurements (when applicable) are obtained utilizing NASCET criteria, using the distal internal carotid diameter as the denominator. CONTRAST:  77mL OMNIPAQUE IOHEXOL 350 MG/ML SOLN COMPARISON:  None. FINDINGS: CT HEAD FINDINGS Brain: The brain shows a normal appearance without evidence of malformation, atrophy, acute small or large vessel infarction, mass lesion, hemorrhage, hydrocephalus or extra-axial collection. Mild chronic small-vessel change of the white matter. 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 Vascular: No hyperdense vessel. No evidence of atherosclerotic calcification. Skull: Normal.  No traumatic finding.  No focal bone lesion. Sinuses/Orbits: Sinuses are clear. Orbits appear normal. Mastoids are clear. Other: None significant Aspects score: 10 CTA NECK FINDINGS Aortic arch: Aortic atherosclerosis. Branching pattern is normal without origin stenosis. Right carotid system: Common carotid artery widely patent to the bifurcation. Scattered plaque formation but no stenosis. Carotid bifurcation shows soft and calcified plaque but there is no stenosis compared to the more distal cervical ICA diameter. Left carotid system: Common carotid artery shows scattered plaque but is widely patent to the bifurcation. Soft and calcified plaque at the carotid bifurcation and ICA bulb but no stenosis compared to the more distal cervical ICA diameter. Vertebral arteries: Early origin of the right  vertebral artery. Widely patent. Left vertebral artery is normal. Skeleton: Ordinary cervical spondylosis. Other neck: No mass or lymphadenopathy. Upper chest: Emphysema and pulmonary scarring.  No active process. Review of the MIP images confirms the above findings CTA HEAD FINDINGS Anterior circulation: Both internal carotid arteries widely patent through the skull base and siphon region. Ordinary siphon atherosclerotic calcification but no stenosis greater than 30%. The anterior and middle cerebral vessels are patent without proximal stenosis, aneurysm or vascular malformation. No large or medium vessel occlusion identified. Posterior circulation: Both vertebral arteries widely patent to the basilar. No basilar stenosis. Posterior circulation branch vessels are normal. Venous sinuses: Patent and normal. Anatomic variants: None significant. Review of the MIP images confirms the above findings IMPRESSION: Head CT: No acute finding. Aspects 10. 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 CT angiography of the neck: Aortic atherosclerosis and emphysema. Atherosclerotic plaque affecting both common carotid arteries but without measurable stenosis. Soft and calcified plaque at both carotid bifurcations and ICA bulbs but without measurable stenosis. No posterior circulation pathology. CT angiography of the head: No intracranial large or  medium vessel occlusion. No correctable proximal stenosis. These results were communicated to Dr. Otelia Limes at 12:33 pmon 3/12/2021by text page via the [facility] messaging system. Electronically Signed   By: Paulina Fusi M.D.   On: 07/17/2019 12:36   CT HEAD CODE STROKE WO CONTRAST  Result Date: 07/17/2019 CLINICAL DATA:  Intermittent left-sided numbness in weakness beginning 0730 hours EXAM: CT ANGIOGRAPHY HEAD AND NECK TECHNIQUE: Multidetector CT imaging of the head and neck was performed using the  standard protocol during bolus administration of intravenous contrast. Multiplanar CT image reconstructions and MIPs were obtained to evaluate the vascular anatomy. Carotid stenosis measurements (when applicable) are obtained utilizing NASCET criteria, using the distal internal carotid diameter as the denominator. CONTRAST:  32mL OMNIPAQUE IOHEXOL 350 MG/ML SOLN COMPARISON:  None. FINDINGS: CT HEAD FINDINGS Brain: The brain shows a normal appearance without evidence of malformation, atrophy, acute small or large vessel infarction, mass lesion, hemorrhage, hydrocephalus or extra-axial collection. Mild chronic small-vessel change of the white matter. 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 Vascular: No hyperdense vessel. No evidence of atherosclerotic calcification. Skull: Normal.  No traumatic finding.  No focal bone lesion. Sinuses/Orbits: Sinuses are clear. Orbits appear normal. Mastoids are clear. Other: None significant Aspects score: 10 CTA NECK FINDINGS Aortic arch: Aortic atherosclerosis. Branching pattern is normal without origin stenosis. Right carotid system: Common carotid artery widely patent to the bifurcation. Scattered plaque formation but no stenosis. Carotid bifurcation shows soft and calcified plaque but there is no stenosis compared to the more distal cervical ICA diameter. Left carotid system: Common carotid artery shows scattered plaque but is widely patent to the bifurcation. Soft and calcified plaque at the carotid bifurcation and ICA bulb but no stenosis compared to the more distal cervical ICA diameter. Vertebral arteries: Early origin of the right vertebral artery. Widely patent. Left vertebral artery is normal. Skeleton: Ordinary cervical spondylosis. Other neck: No mass or lymphadenopathy. Upper chest: Emphysema and pulmonary scarring.  No active process. Review of the MIP images confirms the above findings CTA HEAD FINDINGS Anterior  circulation: Both internal carotid arteries widely patent through the skull base and siphon region. Ordinary siphon atherosclerotic calcification but no stenosis greater than 30%. The anterior and middle cerebral vessels are patent without proximal stenosis, aneurysm or vascular malformation. No large or medium vessel occlusion identified. Posterior circulation: Both vertebral arteries widely patent to the basilar. No basilar stenosis. Posterior circulation branch vessels are normal. Venous sinuses: Patent and normal. Anatomic variants: None significant. Review of the MIP images confirms the above findings IMPRESSION: Head CT: No acute finding. Aspects 10. 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 CT angiography of the neck: Aortic atherosclerosis and emphysema. Atherosclerotic plaque affecting both common carotid arteries but without measurable stenosis. Soft and calcified plaque at both carotid bifurcations and ICA bulbs but without measurable stenosis. No posterior circulation pathology. CT angiography of the head: No intracranial large or medium vessel occlusion. No correctable proximal stenosis. These results were communicated to Dr. Otelia Limes at 12:33 pmon 3/12/2021by text page via the [facility] messaging system. Electronically Signed   By: Paulina Fusi M.D.   On: 07/17/2019 12:36    Procedures Procedures (including critical care time)  Medications Ordered in ED Medications  sodium chloride flush (NS) 0.9 % injection 3 mL (has no administration in time range)  labetalol (NORMODYNE) injection 20 mg (has no administration in time range)  And  clevidipine (CLEVIPREX) infusion 0.5 mg/mL (has no administration in time range)  iohexol (OMNIPAQUE) 350 MG/ML injection 75 mL (75 mLs Intravenous Contrast Given 07/17/19 1222)    ED Course  I have reviewed the triage vital signs and the nursing notes.  Pertinent labs &  imaging results that were available during my care of the patient were reviewed by me and considered in my medical decision making (see chart for details).    MDM Rules/Calculators/A&P                      Patient with focal neuro deficits.  Stuttering.  However had been normal at 10 AM.  Code stroke called due to some deficits on my initial exam.  By the time neurology examined symptoms had resolved.  Head CT done and reassuring.  Severely hypertensive with pressure up to 230 systolic.  Off her medicines.  Neurology believes this is hypertensive emergency.  Will benefit from aggressive blood pressure control.  IV antihypertensive started.  Will admit to ICU.  CRITICAL CARE Performed by: Benjiman Core Total critical care time: 30 minutes Critical care time was exclusive of separately billable procedures and treating other patients. Critical care was necessary to treat or prevent imminent or life-threatening deterioration. Critical care was time spent personally by me on the following activities: development of treatment plan with patient and/or surrogate as well as nursing, discussions with consultants, evaluation of patient's response to treatment, examination of patient, obtaining history from patient or surrogate, ordering and performing treatments and interventions, ordering and review of laboratory studies, ordering and review of radiographic studies, pulse oximetry and re-evaluation of patient's condition.  Final Clinical Impression(s) / ED Diagnoses Final diagnoses:  Hypertensive emergency    Rx / DC Orders ED Discharge Orders    None       Benjiman Core, MD 07/17/19 1254

## 2019-07-17 NOTE — ED Notes (Signed)
Dr. Otelia Limes ED Provider at bedside.

## 2019-07-17 NOTE — ED Notes (Signed)
EDP notified of pts complaints and blood pressure.

## 2019-07-17 NOTE — ED Triage Notes (Signed)
Patient presents to the ED by EMS with c/o heaviness on the left side, left leg dragging and couldn't pick up her phone at 2300. She went to breakfast this morning and had a return or the heaviness. Numbness on left leg below the knee with EMS all symptoms have resolved prior to arrival. Off all prescriptive meds. LVO=0 200/102

## 2019-07-17 NOTE — H&P (Signed)
NAME:  Jasmine [name], MRN:  [id number], DOB:  June 07, 1950, LOS: 0 ADMISSION DATE:  07/17/2019, CONSULTATION DATE:  3/12 REFERRING MD:  pickering, CHIEF COMPLAINT:  Hypertensive crisis    Brief History   69 year old white female admitted 3/12 with hypertensive emergency with associated neurological deficits including left-sided weakness and slurred speech  History of present illness   This is a 69 year old white female with history as mentioned below presents to the emergency room with acute onset of left-sided weakness, dysarthria, and status post fall.  Was in usual state of health up until this morning when the symptoms occurred, she first noted them when she was trying to get out of bed.  The symptoms would resolve spontaneously, then recur, because of this she presented to the emergency room.  In the ER On initial evaluation blood pressure was 200/102, CT of head was negative for acute findings however there was mention of old small vessel infarct in the left basal ganglia/external capsule.  Neurology was consulted and felt constellation of findings were more consistent with hypertensive encephalopathy and recommended strict blood pressure control critical care asked to admit Denies: Headache, fevers, sore throat, chest pain, shortness of breath, wheezing, dizziness, loss of strength prior to this morning, speech change prior to this morning, falls prior to this morning.  Denies nausea vomiting or diarrhea.  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 rate 14, height 5\' 1"  (1.549 m), weight 48.6 kg, SpO2 96 %.       No intake or output data in the 24 hours ending 07/17/19 1317 Filed Weights   07/17/19 1133 07/17/19 1158  Weight: 48.1 kg 48.6 kg    Examination: General: 69 year old white female currently resting in bed she is in no acute distress HENT: Normocephalic atraumatic no jugular venous distention pupils equal reactive to light speech slightly slurred Lungs: Clear to auscultation without accessory use Cardiovascular: Regular rate and rhythm no murmur rub or gallop appreciated Abdomen: Soft nontender Extremities: Warm dry brisk cap refill no edema Neuro: Awake oriented no focal deficits appreciated currently both extremities equal GU: Due to void  Resolved Hospital Problem list     Assessment & Plan:  Hypertensive crisis/hypertensive emergency with neurological deficits -These deficits wax and wane depending on blood pressure -highest recorded systolic 230 about 2 hrs ago -uds only + for benzos (she has script for) Plan We will admit to ICU Will shoot for SBP goal < 170 and DBP goal < 100 over the next 23 hrs then aim towards 120-140 goal after that; starting Cleviprex infusion; initiate oral therapy in am   Serial neuro checks Am chemistry Consider CT angiogram to r/o RAS at some point  Tobacco abuse Plan Smoking cessation recommended   Hypokalemia Plan Replace and recheck   H/o anxiety Plan Cont PRN xanax   Best practice:  Diet: heart healthy  Pain/Anxiety/Delirium protocol (if indicated): NA VAP protocol (if indicated): NA DVT prophylaxis: heparin [place] GI prophylaxis: NA Glucose control: NA Mobility: w/ assist  Code Status: full code  Family  Communication: pending  Disposition: ICU admit   Labs   CBC: Recent Labs  Lab 07/17/19 1141 07/17/19 1204  WBC 10.7*  --   NEUTROABS 7.0  --   HGB 14.7 13.9  HCT 44.9 41.0  MCV 89.4  --   PLT 261  --     Basic Metabolic Panel: Recent Labs  Lab 07/17/19 1141 07/17/19 1204  NA 143 143  K 3.5 3.4*  CL 108 107  CO2 26  --   GLUCOSE 83 78  BUN <5* 4*  CREATININE 0.64 0.60  CALCIUM 9.7  --    GFR: Estimated Creatinine Clearance: 51.5 mL/min (by C-G formula based on SCr of 0.6 mg/dL). Recent Labs  Lab 07/17/19 1141  WBC 10.7*    Liver Function Tests: No results for input(s): AST, ALT, ALKPHOS, BILITOT, PROT, ALBUMIN in the last 168 hours. No results for input(s): LIPASE, AMYLASE in the last 168 hours. No results for input(s): AMMONIA in the last 168 hours.  ABG    Component Value Date/Time   TCO2 28 07/17/2019 1204     Coagulation Profile: Recent Labs  Lab 07/17/19 1154  INR 0.9    Cardiac Enzymes: No results for input(s): CKTOTAL, CKMB, CKMBINDEX, TROPONINI in the last 168 hours.  HbA1C: No results found for: HGBA1C  CBG: Recent Labs  Lab 07/17/19 1142  GLUCAP 88    Review of Systems:   Review of Systems  Constitutional: Negative.   HENT: Negative.   Eyes: Negative.   Respiratory: Negative.   Gastrointestinal: Negative.   Genitourinary: Negative.   Musculoskeletal: Negative.   Skin: Negative.   Neurological: Positive for dizziness, sensory change, focal weakness and weakness.  Endo/Heme/Allergies: Negative.   Psychiatric/Behavioral: Negative.      Past Medical History  She,  has a past medical history of Hypertension.   Surgical History    Past Surgical History:  Procedure Laterality Date  . TUBAL LIGATION  1981     Social History   reports that she has been smoking cigarettes. She has been smoking about 1.00 pack per day. She has never used smokeless tobacco. She reports previous alcohol use. She reports previous drug use.    Family History   Her family history is not on file.   Allergies No Known Allergies   Home Medications  Prior to Admission medications   Not on File     Critical care time: 33 minutes    Erick Colace ACNP-BC [place] Pager # [phone] OR # [phone] if no answer

## 2019-07-18 ENCOUNTER — Inpatient Hospital Stay (HOSPITAL_COMMUNITY): Payer: Medicare Other

## 2019-07-18 DIAGNOSIS — I6389 Other cerebral infarction: Secondary | ICD-10-CM

## 2019-07-18 LAB — BASIC METABOLIC PANEL
Anion gap: 9 (ref 5–15)
BUN: 5 mg/dL — ABNORMAL LOW (ref 8–23)
CO2: 21 mmol/L — ABNORMAL LOW (ref 22–32)
Calcium: 9.5 mg/dL (ref 8.9–10.3)
Chloride: 110 mmol/L (ref 98–111)
Creatinine, Ser: 0.54 mg/dL (ref 0.44–1.00)
GFR calc Af Amer: >60 mL/min (ref >60)
GFR calc non Af Amer: >60 mL/min (ref >60)
Glucose, Bld: 119 mg/dL — ABNORMAL HIGH (ref 70–99)
Potassium: 3.2 mmol/L — ABNORMAL LOW (ref 3.5–5.1)
Sodium: 140 mmol/L (ref 135–145)

## 2019-07-18 LAB — CBC
HCT: 43 % (ref 36.0–46.0)
Hemoglobin: 14.8 g/dL (ref 12.0–15.0)
MCH: 29.8 pg (ref 26.0–34.0)
MCHC: 34.4 g/dL (ref 30.0–36.0)
MCV: 86.5 fL (ref 80.0–100.0)
Platelets: 281 10*3/uL (ref 150–400)
RBC: 4.97 MIL/uL (ref 3.87–5.11)
RDW: 12.8 % (ref 11.5–15.5)
WBC: 13.8 10*3/uL — ABNORMAL HIGH (ref 4.0–10.5)
nRBC: 0 % (ref 0.0–0.2)

## 2019-07-18 LAB — ECHOCARDIOGRAM COMPLETE
Height: 61 in
Weight: 1713.6 oz

## 2019-07-18 LAB — MAGNESIUM: Magnesium: 1.9 mg/dL (ref 1.7–2.4)

## 2019-07-18 LAB — PHOSPHORUS: Phosphorus: 2.5 mg/dL (ref 2.5–4.6)

## 2019-07-18 MED ORDER — ASPIRIN 325 MG PO TABS
325.0000 mg | ORAL_TABLET | Freq: Once | ORAL | Status: AC
  Administered 2019-07-18: 325 mg via ORAL
  Filled 2019-07-18: qty 1

## 2019-07-18 MED ORDER — LORAZEPAM 2 MG/ML IJ SOLN
1.0000 mg | Freq: Once | INTRAMUSCULAR | Status: AC
  Administered 2019-07-18: 1 mg via INTRAVENOUS
  Filled 2019-07-18: qty 1

## 2019-07-18 MED ORDER — AMLODIPINE BESYLATE 5 MG PO TABS
5.0000 mg | ORAL_TABLET | Freq: Every day | ORAL | Status: DC
  Administered 2019-07-18 – 2019-07-19 (×2): 5 mg via ORAL
  Filled 2019-07-18 (×2): qty 1

## 2019-07-18 MED ORDER — ALPRAZOLAM 0.25 MG PO TABS
0.2500 mg | ORAL_TABLET | Freq: Once | ORAL | Status: AC | PRN
  Administered 2019-07-18: 0.25 mg via ORAL
  Filled 2019-07-18: qty 1

## 2019-07-18 MED ORDER — POTASSIUM CHLORIDE CRYS ER 20 MEQ PO TBCR
40.0000 meq | EXTENDED_RELEASE_TABLET | Freq: Once | ORAL | Status: AC
  Administered 2019-07-18: 40 meq via ORAL
  Filled 2019-07-18: qty 2

## 2019-07-18 MED ORDER — HYDROCHLOROTHIAZIDE 12.5 MG PO CAPS
12.5000 mg | ORAL_CAPSULE | Freq: Every day | ORAL | Status: DC
  Filled 2019-07-18: qty 1

## 2019-07-18 MED ORDER — HYDRALAZINE HCL 20 MG/ML IJ SOLN: 5.0000 mg | Freq: Four times a day (QID) | INTRAMUSCULAR | Status: DC | PRN

## 2019-07-18 MED ORDER — ASPIRIN 325 MG PO TABS
325.0000 mg | ORAL_TABLET | Freq: Every day | ORAL | Status: DC
  Administered 2019-07-19: 325 mg via ORAL
  Filled 2019-07-18: qty 1

## 2019-07-18 MED ORDER — HYDRALAZINE HCL 50 MG PO TABS
50.0000 mg | ORAL_TABLET | Freq: Four times a day (QID) | ORAL | Status: DC | PRN
  Administered 2019-07-18 – 2019-07-19 (×5): 50 mg via ORAL
  Filled 2019-07-18 (×5): qty 1

## 2019-07-18 NOTE — Progress Notes (Signed)
eLink Physician-Brief Progress Note Patient Name: Jasmine [name] DOB: [birth year] MRN: [id number]   Date of Service  07/18/2019  HPI/Events of Note  Asking for IV hydralazine, Xanax as at home. Discussed with bed side RN.  Neuro notes seen, said to start asa 325 mg od as at home, SBP 130 to 150 goal. On Clevedepine gtt, oral hydralaizne already. Received amlodepine earlier.  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 The acute stroke is in a small vessel distribution. 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   eICU Interventions  - IV hydralazine prn for SBP > 180. - call back if SBP > 160. - xanax oral once and asa 325 mg once ordered. Bed side to contact Dr Wilford Corner Neuro for asa clarification and SBP goals.         Ranee Gosselin 07/18/2019, 7:57 PM

## 2019-07-18 NOTE — Progress Notes (Signed)
Pt complaining of tingling sensation to the left arm, NIH remains 0. Dr. Wilford Corner notified.

## 2019-07-18 NOTE — Progress Notes (Signed)
  Echocardiogram 2D Echocardiogram has been performed.  [name] 07/18/2019, 8:51 AM

## 2019-07-18 NOTE — Progress Notes (Signed)
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 The acute stroke is in a small vessel distribution. 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  Marland Kitchen amLODipine  5 mg Oral Daily  . Chlorhexidine Gluconate Cloth  6 each Topical Daily  . cholecalciferol  1,000 Units Oral Daily  . heparin  5,000 Units Subcutaneous Q8H  Clevidipine gtt   Recommendations:  -- The acute stroke is not likely to have a significant ischemic penumbra due to its small size.  -- Continue BP management with Clevidipine with SBP goal of 130-150 -- Stroke team to see in the AM.   -- Restart home ASA 325 mg po qd  Electronically signed: Dr. Caryl Pina

## 2019-07-18 NOTE — Progress Notes (Signed)
NAME:  Jasmine [name], MRN:  [id number], DOB:  Jun 26, 1950, LOS: 1 ADMISSION DATE:  07/17/2019, CONSULTATION DATE:  3/12 REFERRING MD:  pickering, CHIEF COMPLAINT:  Hypertensive crisis    Brief History   69 year old white female admitted 3/12 with hypertensive emergency with associated neurological deficits including left-sided weakness and slurred speech  History of present illness   This is a 69 year old white female with history as mentioned below presents to the emergency room with acute onset of left-sided weakness, dysarthria, and status post fall.  Was in usual state of health up until this morning when the symptoms occurred, she first noted them when she was trying to get out of bed.  The symptoms would resolve spontaneously, then recur, because of this she presented to the emergency room.  In the ER On initial evaluation blood pressure was 200/102, CT of head was negative for acute findings however there was mention of old small vessel infarct in the left basal ganglia/external capsule.  Neurology was consulted and felt constellation of findings were more consistent with hypertensive encephalopathy and recommended strict blood pressure control critical care asked to admit Denies: Headache, fevers, sore throat, chest pain, shortness of breath, wheezing, dizziness, loss of strength prior to this morning, speech change prior to this morning, falls prior to this morning.  Denies nausea vomiting or diarrhea.  Past Medical History  HTN, anxiety, tobacco abuse  Significant Hospital Events   3/12: Admitted for hypertensive emergency, started on Cleviprex infusion  Consults:  Neurology consulted in the emergency room  Procedures:    Significant Diagnostic Tests:  CT brain, and CT angiogram 3/12:, Old small vessel infarct in the left basal ganglia/external capsule and mild small vessel changes in the hemispheric white matter  Micro Data:    Antimicrobials:     Interim  history/subjective:  No complaints this morning. Remains on cleviprex. MRI scheduled this am. Given ativan for anxiety prior to procedure  Objective   Blood pressure (!) 125/94, pulse 86, temperature 98.3 F (36.8 C), temperature source Oral, resp. rate 15, height 5\' 1"  (1.549 m), weight 48.6 kg, SpO2 95 %.        Intake/Output Summary (Last 24 hours) at 07/18/2019 0944 Last data filed at 07/18/2019 0700 Gross per 24 hour  Intake 1911.04 ml  Output 3575 ml  Net -1663.96 ml   Filed Weights   07/17/19 1133 07/17/19 1158  Weight: 48.1 kg 48.6 kg   Physical Exam: General: Well-appearing, no acute distress HENT: [place], AT, OP clear, MMM Eyes: EOMI, no scleral icterus Respiratory: Clear to auscultation bilaterally.  No crackles, wheezing or rales Cardiovascular: RRR, -M/R/G, no JVD GI: BS+, soft, nontender Extremities:-Edema,-tenderness Neuro: AAO x4, CNII-XII grossly intact Skin: Intact, no rashes or bruising Psych: Normal mood, normal affect  Resolved Hospital Problem list     Assessment & Plan:   Hypertensive crisis/hypertensive emergency with neurological deficits -These deficits wax and wane depending on blood pressure -highest recorded systolic 086 about 2 hrs ago -uds only + for benzos (she has script for) Plan Wean Cleviprex Start amlodipine 5 mg Serial neuro checks  Tobacco abuse Plan Smoking cessation recommended   Hypokalemia Plan Replete  H/o anxiety Plan Cont PRN xanax  Ativan OTO prior to brain imaging  Discussed plan with RN and pharmacy team  Best practice:  Diet: heart healthy Pain/Anxiety/Delirium protocol (if indicated): NA VAP protocol (if indicated): NA DVT prophylaxis: heparin [place] GI prophylaxis: NA Glucose control: NA Mobility: w/ assist  Code Status: full code  Family Communication: Updated patient at bedside on 3/13 Disposition: ICU  Labs   CBC: Recent Labs  Lab 07/17/19 1141 07/17/19 1204 07/18/19 0352  WBC 10.7*  --  13.8*   NEUTROABS 7.0  --   --   HGB 14.7 13.9 14.8  HCT 44.9 41.0 43.0  MCV 89.4  --  86.5  PLT 261  --  281    Basic Metabolic Panel: Recent Labs  Lab 07/17/19 1141 07/17/19 1204 07/18/19 0352  NA 143 143 140  K 3.5 3.4* 3.2*  CL 108 107 110  CO2 26  --  21*  GLUCOSE 83 78 119*  BUN <5* 4* 5*  CREATININE 0.64 0.60 0.54  CALCIUM 9.7  --  9.5  MG  --   --  1.9  PHOS  --   --  2.5   GFR: Estimated Creatinine Clearance: 51.5 mL/min (by C-G formula based on SCr of 0.54 mg/dL). Recent Labs  Lab 07/17/19 1141 07/18/19 0352  WBC 10.7* 13.8*    Liver Function Tests: No results for input(s): AST, ALT, ALKPHOS, BILITOT, PROT, ALBUMIN in the last 168 hours. No results for input(s): LIPASE, AMYLASE in the last 168 hours. No results for input(s): AMMONIA in the last 168 hours.  ABG    Component Value Date/Time   TCO2 28 07/17/2019 1204     Coagulation Profile: Recent Labs  Lab 07/17/19 1154  INR 0.9    Cardiac Enzymes: No results for input(s): CKTOTAL, CKMB, CKMBINDEX, TROPONINI in the last 168 hours.  HbA1C: No results found for: HGBA1C  CBG: Recent Labs  Lab 07/17/19 1142  GLUCAP 88    The patient is critically ill with multiple organ systems failure and requires high complexity decision making for assessment and support, frequent evaluation and titration of therapies, application of advanced monitoring technologies and extensive interpretation of multiple databases.   Critical Care Time devoted to patient care services described in this note is 40 Minutes.   Mechele Collin, M.D. [facility] Pulmonary/Critical Care Medicine 07/18/2019 9:44 AM   Please see Amion for pager number to reach on-call Pulmonary and Critical Care Team.

## 2019-07-19 DIAGNOSIS — I639 Cerebral infarction, unspecified: Secondary | ICD-10-CM

## 2019-07-19 LAB — BASIC METABOLIC PANEL
Anion gap: 10 (ref 5–15)
BUN: <5 mg/dL — ABNORMAL LOW (ref 8–23)
CO2: 21 mmol/L — ABNORMAL LOW (ref 22–32)
Calcium: 9.7 mg/dL (ref 8.9–10.3)
Chloride: 104 mmol/L (ref 98–111)
Creatinine, Ser: 0.43 mg/dL — ABNORMAL LOW (ref 0.44–1.00)
GFR calc Af Amer: >60 mL/min (ref >60)
GFR calc non Af Amer: >60 mL/min (ref >60)
Glucose, Bld: 133 mg/dL — ABNORMAL HIGH (ref 70–99)
Potassium: 4 mmol/L (ref 3.5–5.1)
Sodium: 135 mmol/L (ref 135–145)

## 2019-07-19 LAB — LIPID PANEL
Cholesterol: 235 mg/dL — ABNORMAL HIGH (ref 0–200)
HDL: 49 mg/dL (ref >40)
LDL Cholesterol: 158 mg/dL — ABNORMAL HIGH (ref 0–99)
Total CHOL/HDL Ratio: 4.8 RATIO
Triglycerides: 138 mg/dL (ref <150)
VLDL: 28 mg/dL (ref 0–40)

## 2019-07-19 LAB — TRIGLYCERIDES: Triglycerides: 141 mg/dL (ref <150)

## 2019-07-19 LAB — HEMOGLOBIN A1C
Hgb A1c MFr Bld: 5.3 % (ref 4.8–5.6)
Mean Plasma Glucose: 105.41 mg/dL

## 2019-07-19 MED ORDER — ATORVASTATIN CALCIUM 80 MG PO TABS
80.0000 mg | ORAL_TABLET | Freq: Every day | ORAL | Status: DC
  Administered 2019-07-20: 80 mg via ORAL
  Filled 2019-07-19: qty 1

## 2019-07-19 MED ORDER — HYDROCHLOROTHIAZIDE 12.5 MG PO CAPS
12.5000 mg | ORAL_CAPSULE | Freq: Every day | ORAL | Status: DC
  Administered 2019-07-19: 12.5 mg via ORAL

## 2019-07-19 MED ORDER — CLOPIDOGREL BISULFATE 75 MG PO TABS
75.0000 mg | ORAL_TABLET | Freq: Every day | ORAL | Status: DC
  Administered 2019-07-19 – 2019-07-21 (×3): 75 mg via ORAL
  Filled 2019-07-19 (×3): qty 1

## 2019-07-19 MED ORDER — AMLODIPINE BESYLATE 10 MG PO TABS
10.0000 mg | ORAL_TABLET | Freq: Every day | ORAL | Status: DC
  Administered 2019-07-20 – 2019-07-21 (×2): 10 mg via ORAL
  Filled 2019-07-19 (×2): qty 1

## 2019-07-19 MED ORDER — ASPIRIN EC 81 MG PO TBEC
81.0000 mg | DELAYED_RELEASE_TABLET | Freq: Every day | ORAL | Status: DC
  Administered 2019-07-20 – 2019-07-21 (×2): 81 mg via ORAL
  Filled 2019-07-19 (×2): qty 1

## 2019-07-19 MED ORDER — BENAZEPRIL HCL 5 MG PO TABS
10.0000 mg | ORAL_TABLET | Freq: Every day | ORAL | Status: DC
  Administered 2019-07-19: 10 mg via ORAL
  Filled 2019-07-19: qty 2

## 2019-07-19 MED ORDER — HYDROCHLOROTHIAZIDE 12.5 MG PO CAPS
12.5000 mg | ORAL_CAPSULE | Freq: Once | ORAL | Status: AC
  Administered 2019-07-19: 12.5 mg via ORAL
  Filled 2019-07-19: qty 1

## 2019-07-19 MED ORDER — HYDROCHLOROTHIAZIDE 25 MG PO TABS
25.0000 mg | ORAL_TABLET | Freq: Every day | ORAL | Status: DC
  Administered 2019-07-20 – 2019-07-21 (×2): 25 mg via ORAL
  Filled 2019-07-19 (×2): qty 1

## 2019-07-19 MED ORDER — LORAZEPAM 0.5 MG PO TABS
0.5000 mg | ORAL_TABLET | Freq: Three times a day (TID) | ORAL | Status: DC
  Administered 2019-07-19 – 2019-07-20 (×2): 0.5 mg via ORAL
  Filled 2019-07-19 (×2): qty 1

## 2019-07-19 NOTE — Plan of Care (Signed)
Called by the RN regarding blood pressure parameters for this patient. Small vessel stroke and hypertensive emergency. Goal blood pressures yesterday were recommended to be 1 30-1 50 but I think she can be gradually brought down and for now I will put the goal blood pressures to try to maintain them below 160 systolic. I have given her an additional 12.5 of hydrochlorothiazide. I have also added standing doses of Ativan-she is on home Xanax which has not been resumed during the hospital stay. Benzo withdrawal might be contributing to her high pressures as well. CCM team to reassess and optimize antihypertensives in the morning. From a stroke standpoint, does not need permissive hypertension anymore and blood pressures can be gradually normalized for discharge blood pressure goal of less than 140 systolic with 20% reduction every day as with any hypertensive urgency/emergency treatment. -- Milon Dikes, MD [facility] Pager: [phone] If 7pm to 7am, please call on call as listed on AMION.

## 2019-07-19 NOTE — Progress Notes (Signed)
NAME:  Jasmine [name], MRN:  [id number], DOB:  [birth year], LOS: 2 ADMISSION DATE:  07/17/2019, CONSULTATION DATE:  3/12 REFERRING MD:  pickering, CHIEF COMPLAINT:  Hypertensive crisis    Brief History   69 year old white female admitted 3/12 with hypertensive emergency with associated neurological deficits including left-sided weakness and slurred speech  History of present illness   This is a 69 year old white female with history as mentioned below presents to the emergency room with acute onset of left-sided weakness, dysarthria, and status post fall.  Was in usual state of health up until this morning when the symptoms occurred, she first noted them when she was trying to get out of bed.  The symptoms would resolve spontaneously, then recur, because of this she presented to the emergency room.  In the ER On initial evaluation blood pressure was 200/102, CT of head was negative for acute findings however there was mention of old small vessel infarct in the left basal ganglia/external capsule.  Neurology was consulted and felt constellation of findings were more consistent with hypertensive encephalopathy and recommended strict blood pressure control critical care asked to admit Denies: Headache, fevers, sore throat, chest pain, shortness of breath, wheezing, dizziness, loss of strength prior to this morning, speech change prior to this morning, falls prior to this morning.  Denies nausea vomiting or diarrhea.  Past Medical History  HTN, anxiety, tobacco abuse  Significant Hospital Events   3/12: Admitted for hypertensive emergency, started on Cleviprex infusion  Consults:  Neurology consulted in the emergency room  Procedures:    Significant Diagnostic Tests:  CT brain, and CT angiogram 3/12:, Old small vessel infarct in the left basal ganglia/external capsule and mild small vessel changes in the hemispheric white matter  MR Brain 3/13 - Acute right basal ganglia infarct  Micro  Data:    Antimicrobials:     Interim history/subjective:  Weaned to Cleviprex 68. No complaints otherwise.  Objective   Blood pressure 132/71, pulse 85, temperature 98.5 F (36.9 C), temperature source Oral, resp. rate 20, height 5\' 1"  (1.549 m), weight 48.6 kg, SpO2 92 %.        Intake/Output Summary (Last 24 hours) at 07/19/2019 1103 Last data filed at 07/19/2019 1058 Gross per 24 hour  Intake 676.7 ml  Output 2025 ml  Net -1348.3 ml   Filed Weights   07/17/19 1133 07/17/19 1158  Weight: 48.1 kg 48.6 kg   Physical Exam: General: Well-appearing, no acute distress HENT: [place], AT, OP clear, MMM Eyes: EOMI, no scleral icterus Respiratory: Clear to auscultation bilaterally.  No crackles, wheezing or rales Cardiovascular: RRR, -M/R/G, no JVD GI: BS+, soft, nontender Extremities:-Edema,-tenderness Neuro: AAO x4, CNII-XII grossly intact Skin: Intact, no rashes or bruising Psych: Normal mood, normal affect  Resolved Hospital Problem list     Assessment & Plan:   Hypertensive crisis/hypertensive emergency with resultant acute right basal ganglia infarct Plan Wean Cleviprex for goal SBP 130-150 Continue amlodipine 5 mg Continue HCTZ as this seems effective in lowering her pressure. Will cancel benazepril as this has not been given yet Serial neuro checks Stroke team consulted by Neurology. Will restart home ASA 325 mg.  Tobacco abuse Plan Smoking cessation recommended   Hypokalemia Plan Recheck BMP Replete as needed  H/o anxiety Plan Cont PRN xanax   Best practice:  Diet: heart healthy Pain/Anxiety/Delirium protocol (if indicated): NA VAP protocol (if indicated): NA DVT prophylaxis: heparin [place] GI prophylaxis: NA Glucose control: NA Mobility: w/ assist  Code Status: full  code  Family Communication: Updated patient at bedside on 3/13 Disposition: ICU  Labs   CBC: Recent Labs  Lab 07/17/19 1141 07/17/19 1204 07/18/19 0352  WBC 10.7*  --  13.8*   NEUTROABS 7.0  --   --   HGB 14.7 13.9 14.8  HCT 44.9 41.0 43.0  MCV 89.4  --  86.5  PLT 261  --  281    Basic Metabolic Panel: Recent Labs  Lab 07/17/19 1141 07/17/19 1204 07/18/19 0352  NA 143 143 140  K 3.5 3.4* 3.2*  CL 108 107 110  CO2 26  --  21*  GLUCOSE 83 78 119*  BUN <5* 4* 5*  CREATININE 0.64 0.60 0.54  CALCIUM 9.7  --  9.5  MG  --   --  1.9  PHOS  --   --  2.5   GFR: Estimated Creatinine Clearance: 51.5 mL/min (by C-G formula based on SCr of 0.54 mg/dL). Recent Labs  Lab 07/17/19 1141 07/18/19 0352  WBC 10.7* 13.8*    Liver Function Tests: No results for input(s): AST, ALT, ALKPHOS, BILITOT, PROT, ALBUMIN in the last 168 hours. No results for input(s): LIPASE, AMYLASE in the last 168 hours. No results for input(s): AMMONIA in the last 168 hours.  ABG    Component Value Date/Time   TCO2 28 07/17/2019 1204     Coagulation Profile: Recent Labs  Lab 07/17/19 1154  INR 0.9    Cardiac Enzymes: No results for input(s): CKTOTAL, CKMB, CKMBINDEX, TROPONINI in the last 168 hours.  HbA1C: Hgb A1c MFr Bld  Date/Time Value Ref Range Status  07/19/2019 05:15 AM 5.3 4.8 - 5.6 % Final    Comment:    (NOTE) Pre diabetes:          5.7%-6.4% Diabetes:              >6.4% Glycemic control for   <7.0% adults with diabetes     CBG: Recent Labs  Lab 07/17/19 1142  GLUCAP 88    The patient is critically ill with multiple organ systems failure and requires high complexity decision making for assessment and support, frequent evaluation and titration of therapies, application of advanced monitoring technologies and extensive interpretation of multiple databases.   Critical Care Time devoted to patient care services described in this note is 31 Minutes.   Mechele Collin, M.D. [facility] Pulmonary/Critical Care Medicine 07/19/2019 11:03 AM   Please see Amion for pager number to reach on-call Pulmonary and Critical Care Team.

## 2019-07-19 NOTE — Progress Notes (Signed)
STROKE TEAM PROGRESS NOTE   HISTORY OF PRESENT ILLNESS (per record) Jasmine [name] is a 69 y.o. female with a history of HTN presenting to the ED after waking up at 7:30 AM with LUE and LLE weakness. She took a full dose of ASA and the symptoms resolved by 8 AM. The patient then had another spell of the same symptoms lasting from 10 to 11 AM; she also noticed difficulty picking up her phone at that time. She was able to call EMS and was transported to the ED at [facility], arriving at about 11:30 AM. Had endorsed numbness of the left leg below the knee with EMS. When EDP examined the patient, her BP was noted to be 230/108; at that time (11:55 AM) symptoms consistent with a third spell occurred and Code Stroke was called. The symptoms rapidly resolved by 12:05 PM. She was taken directly to CT and a CTA head/neck was also obtained. No hemorrhage or acute hypodensity was seen on CT. No LVO was appreciated on CTA. When she was brought back to her room, BP continued to be elevated at 229/89.     INTERVAL HISTORY Her nurse is at bedside. MRI with small vessel stroke right basal ganglia. She stopped taking all her medications at home. She was not on anti-platelet at home. Will need medical management, trying to put her on oral PO HTN meds, will discharge on DUAP for 3 weeks then asa alone.    OBJECTIVE Vitals:   07/19/19 0530 07/19/19 0545 07/19/19 0600 07/19/19 0615  BP: 135/73 131/71 135/70 132/71  Pulse: 86 80 79 85  Resp: 19 17 15 20   Temp:      TempSrc:      SpO2: 98% 92% 91% 92%  Weight:      Height:        CBC:  Recent Labs  Lab 07/17/19 1141 07/17/19 1141 07/17/19 1204 07/18/19 0352  WBC 10.7*  --   --  13.8*  NEUTROABS 7.0  --   --   --   HGB 14.7   < > 13.9 14.8  HCT 44.9   < > 41.0 43.0  MCV 89.4  --   --  86.5  PLT 261  --   --  281   < > = values in this interval not displayed.    Basic Metabolic Panel:  Recent Labs  Lab 07/17/19 1141 07/17/19 1141 07/17/19 1204  07/18/19 0352  NA 143   < > 143 140  K 3.5   < > 3.4* 3.2*  CL 108   < > 107 110  CO2 26  --   --  21*  GLUCOSE 83   < > 78 119*  BUN <5*   < > 4* 5*  CREATININE 0.64   < > 0.60 0.54  CALCIUM 9.7  --   --  9.5  MG  --   --   --  1.9  PHOS  --   --   --  2.5   < > = values in this interval not displayed.    Lipid Panel:     Component Value Date/Time   TRIG 141 07/19/2019 0515   HgbA1c: No results found for: HGBA1C Urine Drug Screen:     Component Value Date/Time   LABOPIA NONE DETECTED 07/17/2019 1154   COCAINSCRNUR NONE DETECTED 07/17/2019 1154   LABBENZ POSITIVE (A) 07/17/2019 1154   AMPHETMU NONE DETECTED 07/17/2019 1154   THCU NONE DETECTED 07/17/2019 1154  LABBARB NONE DETECTED 07/17/2019 1154    Alcohol Level     Component Value Date/Time   ETH <10 07/17/2019 1307    IMAGING  CT HEAD CODE STROKE WO CONTRAST CT Angio Head W or Wo Contrast CT Angio Neck W and/or Wo Contrast 07/17/2019 IMPRESSION:   Head CT:  No acute finding. Aspects 10. 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   CT angiography of the neck:  Aortic atherosclerosis and emphysema. Atherosclerotic plaque affecting both common carotid arteries but without measurable stenosis. Soft and calcified plaque at both carotid bifurcations and ICA bulbs but without measurable stenosis. No posterior circulation pathology.   CT angiography of the head:  No intracranial large or medium vessel occlusion. No correctable proximal stenosis.   MR BRAIN WO CONTRAST 07/18/2019 IMPRESSION:  1. Acute right basal ganglia infarction.  2. Background of chronic small vessel ischemia in the cerebral white matter and pons.   ECHOCARDIOGRAM COMPLETE 07/18/2019 IMPRESSIONS   1. Left ventricular ejection fraction, by estimation, is 60 to 65%. The left ventricle has normal function. The left ventricle has no regional wall motion  abnormalities. There is moderate left ventricular hypertrophy. Left ventricular diastolic parameters are consistent with Grade I diastolic dysfunction (impaired relaxation).   2. Right ventricular systolic function is normal. The right ventricular size is normal. Tricuspid regurgitation signal is inadequate for assessing PA pressure.   3. The mitral valve is grossly normal. Trivial mitral valve regurgitation.   4. The aortic valve is tricuspid. Aortic valve regurgitation is not visualized.   5. The inferior vena cava is normal in size with greater than 50% respiratory variability, suggesting right atrial pressure of 3 mmHg.    ECG - SR rate 72 BPM. LVH. (See cardiology reading for complete details)   PHYSICAL EXAM Blood pressure 132/71, pulse 85, temperature 98 F (36.7 C), temperature source Oral, resp. rate 20, height 5\' 1"  (1.549 m), weight 48.6 kg, SpO2 92 %.   Exam: NAD, pleasant                  Speech:    Speech is normal; fluent and spontaneous with normal comprehension.  Cognition:    The patient is oriented to person, place, and time;     recent and remote memory intact;     language fluent;    Cranial Nerves:    The pupils are equal, round, and reactive to light.eomi. Trigeminal sensation is intact and the muscles of mastication are normal. The face is symmetric. The palate elevates in the midline. Hearing intact. Voice is normal. Shoulder shrug is normal. The tongue has normal motion without fasciculations.   Coordination:  No dysmetria  Motor Observation:    No asymmetry, no atrophy, and no involuntary movements noted. Tone:    Normal muscle tone.     Strength:    Strength is V/V in the upper and lower limbs.      Sensation: intact to LT   ASSESSMENT/PLAN [name] is a 69 y.o. female with history of hypertension, tobacco use, and previous substance abuse presenting with episodic left sided weakness and elevated BP 230/108.  She did not receive IV  t-PA due to resolution of deficits.  Stroke:  Acute right basal ganglia infarction -  small vessel disease and medical noncompliance.  Resultant  Symptoms resolved  Code Stroke CT Head - No acute finding. Aspects 10. Old small vessel infarctions  in the left basal ganglia/external capsule and mild small vessel change of the hemispheric white matter.   CT head - not ordered  MRI head - Acute right basal ganglia infarction. Background of chronic small vessel ischemia in the cerebral white matter and pons.   MRA head - not ordered  CTA H&N - no high grade stenosis  CT Perfusion - not ordered  Carotid Doppler - CTA neck performed - carotid dopplers not indicated.  2D Echo - EF 60 - 65%. No cardiac source of emboli identified.  [employer] Virus 2 - negative  LDL - will order  HgbA1c - will order  UDS - benzodiazepine  VTE prophylaxis - [place] Heparin Diet  Diet Order            Diet 2 gram sodium Room service appropriate? Yes with Assist; Fluid consistency: Thin  Diet effective now              aspirin 325 mg daily prior to admission, now on aspirin 325 mg daily  Patient counseled to be compliant with her antithrombotic medications. Will recommend DUAP for 3 weeks then ASA alone on discharge.  Ongoing aggressive stroke risk factor management  Therapy recommendations:  pending  Disposition:  Pending  Hypertension  Home BP meds: none   Current BP meds: Cleviprex prn ; Norvasc ; HCTZ ; Hydralazine  Stable . Permissive hypertension (OK if < 220/120) but gradually normalize in 5-7 days  . Long-term BP goal normotensive  Hyperlipidemia  Home Lipid lowering medication: none   LDL pending, goal < 70  Current lipid lowering medication: none  Continue statin at discharge   Other Stroke Risk Factors  Advanced age  Cigarette smoker - advised to stop smoking  Previous ETOH use.  Previous substance Abuse  Other Active Problems  Code status - Full  code  Hypokalemia - 3.2 - supplemented (on HCTZ)  Leukocytosis - 10.7->13.8 (afebrile)  PLAN  Per Dr Jaynee Eagles - adjust PO anti-htn meds in order to get pt off Cleviprex. D/C HCTZ due to low potassium. Add Lotensin. Monitor BP closely. Recheck potassium later today.  Will recommend DUAP for 3 weeks then ASA alone on discharge.  Hospital day # 2  Personally examined patient and images, and have participated in and made any corrections needed to history, physical, neuro exam,assessment and plan as stated above.  I have personally obtained the history, evaluated lab date, reviewed imaging studies and agree with radiology interpretations.    Sarina Ill, MD Stroke Neurology   A total of 35 minutes was spent for the care of this patient, spent on counseling patient and family on different diagnostic and therapeutic options, counseling and coordination of care, riskd ans benefits of management, compliance, or risk factor reduction and education.   To contact Stroke Continuity provider, please refer to [email or web address] After hours, contact General Neurology

## 2019-07-20 DIAGNOSIS — E7800 Pure hypercholesterolemia, unspecified: Secondary | ICD-10-CM

## 2019-07-20 DIAGNOSIS — F17200 Nicotine dependence, unspecified, uncomplicated: Secondary | ICD-10-CM

## 2019-07-20 LAB — BASIC METABOLIC PANEL
Anion gap: 12 (ref 5–15)
BUN: 6 mg/dL — ABNORMAL LOW (ref 8–23)
CO2: 19 mmol/L — ABNORMAL LOW (ref 22–32)
Calcium: 9.6 mg/dL (ref 8.9–10.3)
Chloride: 106 mmol/L (ref 98–111)
Creatinine, Ser: 0.71 mg/dL (ref 0.44–1.00)
GFR calc Af Amer: >60 mL/min (ref >60)
GFR calc non Af Amer: >60 mL/min (ref >60)
Glucose, Bld: 108 mg/dL — ABNORMAL HIGH (ref 70–99)
Potassium: 3.6 mmol/L (ref 3.5–5.1)
Sodium: 137 mmol/L (ref 135–145)

## 2019-07-20 LAB — CBC
HCT: 43.4 % (ref 36.0–46.0)
Hemoglobin: 14.7 g/dL (ref 12.0–15.0)
MCH: 29.5 pg (ref 26.0–34.0)
MCHC: 33.9 g/dL (ref 30.0–36.0)
MCV: 87.1 fL (ref 80.0–100.0)
Platelets: 304 10*3/uL (ref 150–400)
RBC: 4.98 MIL/uL (ref 3.87–5.11)
RDW: 13.4 % (ref 11.5–15.5)
WBC: 14.3 10*3/uL — ABNORMAL HIGH (ref 4.0–10.5)
nRBC: 0 % (ref 0.0–0.2)

## 2019-07-20 MED ORDER — ALPRAZOLAM 0.5 MG PO TABS: 0.5000 mg | ORAL_TABLET | Freq: Three times a day (TID) | ORAL | Status: DC | PRN

## 2019-07-20 MED ORDER — HYDRALAZINE HCL 50 MG PO TABS
50.0000 mg | ORAL_TABLET | Freq: Four times a day (QID) | ORAL | Status: DC
  Administered 2019-07-20 – 2019-07-21 (×5): 50 mg via ORAL
  Filled 2019-07-20 (×5): qty 1

## 2019-07-20 NOTE — Discharge Summary (Signed)
Physician Discharge Summary   Patient ID: Jasmine [name] MRN: [id number] DOB/AGE: Mar 15, 1951 69 y.o.  Admit date: 07/17/2019 Discharge date: 07/21/2019                     Discharge Plan by Diagnosis   Hypertensive crisis/hypertensive emergency - resolved. - Continue amlodipine 41m daily, HCTZ 216mdaily, Hydralazine 5044m6hrs. - Continue ASA, plavix with plans to stop plavix after 3 weeks as below. - DASH diet along with exercise regimen. - Ensure follow up with PCP (importance of this stressed to pt who voiced understanding and stated she is willing to comply).  Right basal ganglia infarct. - Continue ASA and plavix for 3 weeks then stop plavix and continue ASA indefinitely. - [facility] up with [facility] ([facility] and schedule).  Tobacco abuse. - Smoking cessation recommended.   Discharge Summary   Jasmine [name] a 68 19o. y/o female with a PMH of HTN, HLD, tobacco dependence.  She was admitted 07/17/19 with hypertensive emergency, left sided weakness, and dysarthria.  Symptoms began that morning when she first got out of bed.  They would resolve spontaneously then recur suddenly.  She therefore came to ED for further evaluation.  In ED, BP was 230/108 at one point.  CT head was negative for acute event; however, there was mention of an old small vessel infarct in the left basal ganglia / external capsule. MRI 3/13 revealed an acute right basal ganglia infarct.  She was admitted to the ICU and started on cleviprex along with amlodipine and HCTZ.  Overnight 3/14, cleviprex was weaned off and in AM 3/15, SBP was down to 130's.  She was later transferred out of the ICU to a regular floor.  On 3/16, she was deemed medically stable and was cleared for discharge to her home.  She was instructed on the importance of PCP follow up as well as neurology follow up and she voiced understanding.          Significant Hospital Events   3/12 > admitted for  hypertensive emergency, started on Cleviprex infusion. 3/14 > off cleviprex overnight. 3/15 > transferred to floor. 3/16 > discharged.  Significant Diagnostic Studies  CT brain, and CT angiogram 3/12 > Old small vessel infarct in the left basal ganglia/external capsule and mild small vessel changes in the hemispheric white matter, no LVO or proximal stenosis. MR Brain 3/13 > Acute right basal ganglia infarct. Echo 3/13 > EF 60-65%, G1DD.  Micro Data  Flu 3/12 > neg. COVID 3/12 > neg.  Antimicrobials  None.  Consults  Neuro.  Objective:  Blood pressure 126/75, pulse 87, temperature 98.2 F (36.8 C), temperature source Oral, resp. rate 18, height '5\' 1"'  (1.549 m), weight 48.8 kg, SpO2 94 %.        Intake/Output Summary (Last 24 hours) at 07/21/2019 1018 Last data filed at 07/21/2019 0830 Gross per 24 hour  Intake 480 ml  Output --  Net 480 ml   Filed Weights   07/17/19 1133 07/17/19 1158 07/21/19 0500  Weight: 48.1 kg 48.6 kg 48.8 kg    Physical Examination: General: Adult female, walking in room with NT assistance, in NAD. Neuro: A&O x 3, no deficits. HEENT: [place]/AT. Sclerae anicteric. EOMI. Cardiovascular: RRR, no M/R/G.  Lungs: Respirations even and unlabored.  CTA bilaterally, No W/R/R. Abdomen: BS x 4, soft, NT/ND.  Musculoskeletal: No gross deformities, no edema.  Skin: Intact, warm, no rashes.   Discharge Labs:  BMET Recent Labs  Lab 07/17/19 1141 07/17/19 1141 07/17/19 1204 07/17/19 1204 07/18/19 0352 07/18/19 0352 07/19/19 1139 07/19/19 1139 07/20/19 0504 07/21/19 0220  NA 143   < > 143  --  140  --  135  --  137 133*  K 3.5   < > 3.4*   < > 3.2*   < > 4.0   < > 3.6 3.4*  CL 108   < > 107  --  110  --  104  --  106 102  CO2 26  --   --   --  21*  --  21*  --  19* 20*  GLUCOSE 83   < > 78  --  119*  --  133*  --  108* 113*  BUN <5*   < > 4*  --  5*  --  <5*  --  6* 8  CREATININE 0.64   < > 0.60  --  0.54  --  0.43*  --  0.71 0.68  CALCIUM 9.7   --   --   --  9.5  --  9.7  --  9.6 9.6  MG  --   --   --   --  1.9  --   --   --   --   --   PHOS  --   --   --   --  2.5  --   --   --   --   --    < > = values in this interval not displayed.    CBC Recent Labs  Lab 07/18/19 0352 07/20/19 0504 07/21/19 0220  HGB 14.8 14.7 14.6  HCT 43.0 43.4 43.2  WBC 13.8* 14.3* 13.2*  PLT 281 304 333    Anti-Coagulation Recent Labs  Lab 07/17/19 1154  INR 0.9    Discharge Instructions    Ambulatory referral to Neurology   Complete by: As directed    Follow up in stroke clinic at [facility] with Frann Rider, NP in about 4 weeks. If not available, consider Dr. Antony Contras, Dr. Bess Harvest, or Dr. Sarina Ill.   Diet - low sodium heart healthy   Complete by: As directed    Increase activity slowly   Complete by: As directed        Follow-up Information    [facility] Follow up in 4 week(s).   Specialty: Neurology Why: stroke clinic. office will call with appt date and time.  Contact information: [address] [place] [phone]           Allergies as of 07/21/2019   No Known Allergies     Medication List    TAKE these medications   amLODipine 10 MG tablet Commonly known as: NORVASC Take 1 tablet (10 mg total) by mouth daily. Start taking on: July 22, 2019   aspirin 81 MG EC tablet Take 1 tablet (81 mg total) by mouth daily. Start taking on: July 22, 2019 What changed:   medication strength  how much to take  when to take this   atorvastatin 80 MG tablet Commonly known as: LIPITOR Take 1 tablet (80 mg total) by mouth daily at 6 PM.   Black Elderberry 50 MG/5ML Syrp Take 15 mLs by mouth daily.   cholecalciferol 25 MCG (1000 UNIT) tablet Commonly known as: VITAMIN D3 Take 1,000 Units by mouth daily.   clopidogrel 75 MG tablet Commonly known as:  PLAVIX Take 1 tablet (75 mg total) by mouth daily. Start taking  on: July 22, 2019   hydrALAZINE 50 MG tablet Commonly known as: APRESOLINE Take 1 tablet (50 mg total) by mouth every 6 (six) hours.   hydrochlorothiazide 25 MG tablet Commonly known as: HYDRODIURIL Take 1 tablet (25 mg total) by mouth daily. Start taking on: July 22, 2019   ibuprofen 200 MG tablet Commonly known as: ADVIL Take 400 mg by mouth every 6 (six) hours as needed for headache or moderate pain.   multivitamin with minerals tablet Take 1 tablet by mouth daily.   Probiotic-10 Ultimate Caps Take 1 capsule by mouth daily.        Disposition: Home.   Discharge Condition:  Jasmine [name] has met maximum benefit of inpatient care and is medically stable and cleared for discharge.  Patient is pending follow up as above.    Time spent on discharge: Greater than 35 minutes.    Montey Hora, [place] 07/21/2019, 10:18 AM

## 2019-07-20 NOTE — Plan of Care (Signed)
  Problem: Coping: Goal: Level of anxiety will decrease Outcome: Progressing   Problem: Elimination: Goal: Will not experience complications related to bowel motility Outcome: Progressing Goal: Will not experience complications related to urinary retention Outcome: Progressing   Problem: Elimination: Goal: Will not experience complications related to urinary retention Outcome: Progressing   Problem: Pain Managment: Goal: General experience of comfort will improve Outcome: Progressing   Problem: Safety: Goal: Ability to remain free from injury will improve Outcome: Progressing

## 2019-07-20 NOTE — Plan of Care (Signed)
  Problem: Clinical Measurements: Goal: Ability to maintain clinical measurements within normal limits will improve Outcome: Progressing Goal: Will remain free from infection Outcome: Progressing Goal: Diagnostic test results will improve Outcome: Progressing Goal: Cardiovascular complication will be avoided Outcome: Progressing   Problem: Clinical Measurements: Goal: Will remain free from infection Outcome: Progressing   Problem: Clinical Measurements: Goal: Diagnostic test results will improve Outcome: Progressing   Problem: Clinical Measurements: Goal: Cardiovascular complication will be avoided Outcome: Progressing   Problem: Activity: Goal: Risk for activity intolerance will decrease Outcome: Progressing   Problem: Nutrition: Goal: Adequate nutrition will be maintained Outcome: Progressing

## 2019-07-20 NOTE — Progress Notes (Signed)
NAME:  FRANKIE ZITO, MRN:  [id number], DOB:  03-10-1951, LOS: 3 ADMISSION DATE:  07/17/2019, CONSULTATION DATE:  3/12 REFERRING MD:  pickering, CHIEF COMPLAINT:  Hypertensive crisis    Brief History   69 year old white female admitted 3/12 with hypertensive emergency with associated neurological deficits including left-sided weakness and slurred speech  History of present illness   This is a 69 year old white female with history as mentioned below presents to the emergency room with acute onset of left-sided weakness, dysarthria, and status post fall.  Was in usual state of health up until this morning when the symptoms occurred, she first noted them when she was trying to get out of bed.  The symptoms would resolve spontaneously, then recur, because of this she presented to the emergency room.  In the ER On initial evaluation blood pressure was 200/102, CT of head was negative for acute findings however there was mention of old small vessel infarct in the left basal ganglia/external capsule.  Neurology was consulted and felt constellation of findings were more consistent with hypertensive encephalopathy and recommended strict blood pressure control critical care asked to admit Denies: Headache, fevers, sore throat, chest pain, shortness of breath, wheezing, dizziness, loss of strength prior to this morning, speech change prior to this morning, falls prior to this morning.  Denies nausea vomiting or diarrhea.  Past Medical History  HTN, anxiety, tobacco abuse  Significant Hospital Events   3/12: Admitted for hypertensive emergency, started on Cleviprex infusion 3/14 > off cleviprex overnight  Consults:  Neurology consulted in the emergency room  Procedures:    Significant Diagnostic Tests:  CT brain, and CT angiogram 3/12:, Old small vessel infarct in the left basal ganglia/external capsule and mild small vessel changes in the hemispheric white matter MR Brain 3/13 - Acute right  basal ganglia infarct Echo 3/13 > EF 60-65%, G1DD.  Micro Data:    Antimicrobials:     Interim history/subjective:  Off cleviprex.  SBP 140. Comfortable, no complaints.  Objective   Blood pressure 137/85, pulse 84, temperature 98.3 F (36.8 C), temperature source Oral, resp. rate 19, height 5\' 1"  (1.549 m), weight 48.6 kg, SpO2 92 %.        Intake/Output Summary (Last 24 hours) at 07/20/2019 0736 Last data filed at 07/19/2019 2300 Gross per 24 hour  Intake 1901.47 ml  Output 1675 ml  Net 226.47 ml   Filed Weights   07/17/19 1133 07/17/19 1158  Weight: 48.1 kg 48.6 kg   Physical Exam: General: Well-appearing female, in no acute distress HENT: [place], AT, OP clear, MMM Eyes: EOMI, no scleral icterus Respiratory: Clear to auscultation bilaterally.  No crackles, wheezing or rales Cardiovascular: RRR, no M/R/G GI: BS+, soft, nontender Extremities:-Edema,-tenderness Neuro: AAO x4, no deficits Skin: Intact, no rashes or bruising   Assessment & Plan:   Hypertensive crisis/hypertensive emergency with resultant acute right basal ganglia infarct - resolved and now of cleviprex Plan D/c cleviprex Continue amlodipine 10 mg Continue HCTZ as this seems effective in lowering her pressure. Will cancel benazepril as this has not been given yet Continue ASA, plavix Needs PCP follow up - I stressed this to her and she is willing to comply DASH diet, exercise counseling provided  Tobacco abuse Plan Smoking cessation recommended   H/o anxiety Plan Cont PRN xanax (she states she uses this at home).  Transfer to floor.  Can likely d/c in AM 3/16 as long as BP stays controlled off cleviprex and on current regimen.  Best  practice:  Diet: heart healthy Pain/Anxiety/Delirium protocol (if indicated): NA VAP protocol (if indicated): NA DVT prophylaxis: heparin  GI prophylaxis: NA Glucose control: NA Mobility: w/ assist  Code Status: full code  Family Communication: Updated  patient at bedside on 3/13 Disposition: Transfer to med surg.   Montey Hora, [place] [facility] 07/20/2019, 7:45 AM

## 2019-07-20 NOTE — Plan of Care (Signed)

## 2019-07-20 NOTE — Progress Notes (Signed)
STROKE TEAM PROGRESS NOTE   INTERVAL HISTORY Husband at bedside. Pt sitting in bed, neuro stable and symptoms resolved. Educated on smoking cessation and she is willing to treat. On DAPT.    OBJECTIVE Vitals:   07/20/19 0800 07/20/19 0900 07/20/19 1000 07/20/19 1151  BP: (!) 156/77 (!) 146/72  (!) 155/73  Pulse: 76 81 88 68  Resp: 19 17 18 18   Temp: 98.4 F (36.9 C)   98.7 F (37.1 C)  TempSrc: Oral   Oral  SpO2: 92% 94% 95% 98%  Weight:      Height:       CBC:  Recent Labs  Lab 07/17/19 1141 07/17/19 1204 07/18/19 0352 07/20/19 0504  WBC 10.7*   < > 13.8* 14.3*  NEUTROABS 7.0  --   --   --   HGB 14.7   < > 14.8 14.7  HCT 44.9   < > 43.0 43.4  MCV 89.4   < > 86.5 87.1  PLT 261   < > 281 304   < > = values in this interval not displayed.   Basic Metabolic Panel:  Recent Labs  Lab 07/18/19 0352 07/18/19 0352 07/19/19 1139 07/20/19 0504  NA 140   < > 135 137  K 3.2*   < > 4.0 3.6  CL 110   < > 104 106  CO2 21*   < > 21* 19*  GLUCOSE 119*   < > 133* 108*  BUN 5*   < > <5* 6*  CREATININE 0.54   < > 0.43* 0.71  CALCIUM 9.5   < > 9.7 9.6  MG 1.9  --   --   --   PHOS 2.5  --   --   --    < > = values in this interval not displayed.   Lipid Panel:     Component Value Date/Time   CHOL 235 (H) 07/19/2019 0515   TRIG 141 07/19/2019 0515   TRIG 138 07/19/2019 0515   HDL 49 07/19/2019 0515   CHOLHDL 4.8 07/19/2019 0515   VLDL 28 07/19/2019 0515   LDLCALC 158 (H) 07/19/2019 0515   HgbA1c:  Lab Results  Component Value Date   HGBA1C 5.3 07/19/2019   Urine Drug Screen:     Component Value Date/Time   LABOPIA NONE DETECTED 07/17/2019 1154   COCAINSCRNUR NONE DETECTED 07/17/2019 1154   LABBENZ POSITIVE (A) 07/17/2019 1154   AMPHETMU NONE DETECTED 07/17/2019 1154   THCU NONE DETECTED 07/17/2019 1154   LABBARB NONE DETECTED 07/17/2019 1154    Alcohol Level     Component Value Date/Time   ETH <10 07/17/2019 1307    IMAGING  CT HEAD CODE STROKE WO  CONTRAST 07/17/2019 No acute finding. Aspects 10. 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   CT Angio Neck W and/or Wo Contrast 07/17/2019 Aortic atherosclerosis and emphysema. Atherosclerotic plaque affecting both common carotid arteries but without measurable stenosis. Soft and calcified plaque at both carotid bifurcations and ICA bulbs but without measurable stenosis. No posterior circulation pathology.   CT Angio Head W or Wo Contrast 07/17/2019 No intracranial large or medium vessel occlusion. No correctable proximal stenosis.   MR BRAIN WO CONTRAST 07/18/2019 1. Acute right basal ganglia infarction.  2. Background of chronic small vessel ischemia in the cerebral white matter and pons.   ECHOCARDIOGRAM COMPLETE 07/18/2019 1. Left ventricular ejection fraction, by estimation, is 60  to 65%. The left ventricle has normal function. The left ventricle has no regional wall motion abnormalities. There is moderate left ventricular hypertrophy. Left ventricular diastolic parameters are consistent with Grade I diastolic dysfunction (impaired relaxation).   2. Right ventricular systolic function is normal. The right ventricular size is normal. Tricuspid regurgitation signal is inadequate for assessing PA pressure.   3. The mitral valve is grossly normal. Trivial mitral valve regurgitation.   4. The aortic valve is tricuspid. Aortic valve regurgitation is not visualized.   5. The inferior vena cava is normal in size with greater than 50% respiratory variability, suggesting right atrial pressure of 3 mmHg.   ECG - SR rate 72 BPM. LVH. (See cardiology reading for complete details)   PHYSICAL EXAM Blood pressure (!) 155/73, pulse 68, temperature 98.7 F (37.1 C), temperature source Oral, resp. rate 18, height 5\' 1"  (1.549 m), weight 48.6 kg, SpO2 98 %. Exam: NAD, pleasant                  Speech:    Speech  is normal; fluent and spontaneous with normal comprehension.  Cognition:    The patient is oriented to person, place, and time;     recent and remote memory intact;     language fluent;    Cranial Nerves:    The pupils are equal, round, and reactive to light.eomi. Trigeminal sensation is intact and the muscles of mastication are normal. The face is symmetric. The palate elevates in the midline. Hearing intact. Voice is normal. Shoulder shrug is normal. The tongue has normal motion without fasciculations.   Coordination:  No dysmetria  Motor Observation:    No asymmetry, no atrophy, and no involuntary movements noted. Tone:    Normal muscle tone.     Strength:    Strength is V/V in the upper and lower limbs.      Sensation: intact to LT   ASSESSMENT/PLAN [name] is a 69 y.o. female with history of hypertension, tobacco use, and previous substance abuse presenting with episodic left sided weakness and elevated BP 230/108.  She did not receive IV t-PA due to resolution of deficits.  Stroke:  Acute right basal ganglia infarction -  small vessel disease and medical noncompliance.  Resultant  Symptoms resolved  Code Stroke CT Head - No acute finding. Aspects 10. 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   CTA H&N - no high grade stenosis  MRI head - Acute right basal ganglia infarction.   2D Echo - EF 60 - 65%. No cardiac source of emboli identified.  Sars Corona Virus 2 - negative  LDL - 158  HgbA1c - 5.3  UDS - benzodiazepine  VTE prophylaxis - [place] Heparin  aspirin 325 mg daily prior to admission, now on aspirin 81 and plavix 75. Continue DAPT x 3 weeks then plavix alone.   Therapy recommendations:  None  Disposition:  Pending NOTHING FURTHER TO ADD FROM THE STROKE STANDPOINT Follow-up Stroke Clinic at [facility] in 4 weeks. Office will call  with appointment date and time. Order placed.  Hypertension  Stable 140-150s . Gradually normalize BP in 2-3 days  . Long-term BP goal normotensive  Hyperlipidemia  Home Lipid lowering medication: none   LDL 158, goal < 70  Current lipid lowering medication: lipitor 80  Continue statin at discharge  Tobacco abuse  Current smoker  Smoking cessation counseling  provided  Pt is willing to quit  Other Stroke Risk Factors  Advanced age  Previous ETOH use.  Previous substance Abuse  Other Active Problems  Code status - Full code  Hx anxiety on prn xanax PTA  Hypokalemia - 3.2 - supplemented (on HCTZ) 40.->3.6  Leukocytosis - 10.7->13.8 ->14.3 (afebrile)  Hospital day # 3  Neurology will sign off. Please call with questions. Pt will follow up with stroke clinic NP at [facility] in about 4 weeks. Thanks for the consult.  Marvel Plan, MD PhD Stroke Neurology 07/20/2019 2:03 PM    To contact Stroke Continuity provider, please refer to [email or web address]. After hours, contact General Neurology

## 2019-07-21 DIAGNOSIS — I6381 Other cerebral infarction due to occlusion or stenosis of small artery: Secondary | ICD-10-CM

## 2019-07-21 DIAGNOSIS — Z8673 Personal history of transient ischemic attack (TIA), and cerebral infarction without residual deficits: Secondary | ICD-10-CM

## 2019-07-21 DIAGNOSIS — I10 Essential (primary) hypertension: Secondary | ICD-10-CM

## 2019-07-21 LAB — CBC
HCT: 43.2 % (ref 36.0–46.0)
Hemoglobin: 14.6 g/dL (ref 12.0–15.0)
MCH: 29.3 pg (ref 26.0–34.0)
MCHC: 33.8 g/dL (ref 30.0–36.0)
MCV: 86.7 fL (ref 80.0–100.0)
Platelets: 333 10*3/uL (ref 150–400)
RBC: 4.98 MIL/uL (ref 3.87–5.11)
RDW: 13 % (ref 11.5–15.5)
WBC: 13.2 10*3/uL — ABNORMAL HIGH (ref 4.0–10.5)
nRBC: 0 % (ref 0.0–0.2)

## 2019-07-21 LAB — BASIC METABOLIC PANEL
Anion gap: 11 (ref 5–15)
BUN: 8 mg/dL (ref 8–23)
CO2: 20 mmol/L — ABNORMAL LOW (ref 22–32)
Calcium: 9.6 mg/dL (ref 8.9–10.3)
Chloride: 102 mmol/L (ref 98–111)
Creatinine, Ser: 0.68 mg/dL (ref 0.44–1.00)
GFR calc Af Amer: >60 mL/min (ref >60)
GFR calc non Af Amer: >60 mL/min (ref >60)
Glucose, Bld: 113 mg/dL — ABNORMAL HIGH (ref 70–99)
Potassium: 3.4 mmol/L — ABNORMAL LOW (ref 3.5–5.1)
Sodium: 133 mmol/L — ABNORMAL LOW (ref 135–145)

## 2019-07-21 MED ORDER — CLOPIDOGREL BISULFATE 75 MG PO TABS: 75.0000 mg | ORAL_TABLET | Freq: Every day | ORAL | 0 refills | Status: DC

## 2019-07-21 MED ORDER — HYDROCHLOROTHIAZIDE 25 MG PO TABS: 25.0000 mg | ORAL_TABLET | Freq: Every day | ORAL | 1 refills | Status: DC

## 2019-07-21 MED ORDER — ATORVASTATIN CALCIUM 80 MG PO TABS: 80.0000 mg | ORAL_TABLET | Freq: Every day | ORAL | 1 refills | Status: DC

## 2019-07-21 MED ORDER — ASPIRIN 81 MG PO TBEC: 81.0000 mg | DELAYED_RELEASE_TABLET | Freq: Every day | ORAL | 1 refills | Status: DC

## 2019-07-21 MED ORDER — HYDRALAZINE HCL 50 MG PO TABS: 50.0000 mg | ORAL_TABLET | Freq: Four times a day (QID) | ORAL | 1 refills | Status: DC

## 2019-07-21 MED ORDER — AMLODIPINE BESYLATE 10 MG PO TABS: 10.0000 mg | ORAL_TABLET | Freq: Every day | ORAL | 1 refills | Status: DC

## 2019-07-21 NOTE — Progress Notes (Signed)
Discharge instructions reviewed with pt, pt scripts sent to her pharmacy by MD, pt informed.  Copy of instructions given to pt.  Pt waiting for her husband to arrive, he is on his way. Pt instructed to call front desk when husband arrives.

## 2019-07-21 NOTE — Progress Notes (Signed)
Pt's husband here.   Pt d/c'd via wheelchair with belongings, with husband.            Escorted by hospital volunteer.

## 2019-07-28 ENCOUNTER — Other Ambulatory Visit: Payer: Self-pay | Admitting: *Deleted

## 2019-07-28 NOTE — Patient Outreach (Addendum)
[facility]) Care Management  07/28/2019  Jasmine [name] April 16, 1951 [id number]   Subjective: Telephone call to patient's home  number, no answer, left HIPAA compliant voicemail message, and requested call back.   Objective: Per KPN (Knowledge Performance Now, point of care tool) and chart review, patient hospitalized 07/17/2019 - 07/21/2019 for Hypertensive crisis/hypertensive emergency, Right basal ganglia infarct.    Patient also has a history of HLD and  tobacco dependence.    Assessment:   Received [employer] EMMI Stroke [employer] Alert follow up referral on 07/27/2019.   Red Flag Alert Triggers, Day #3, times 2, patient answered no to the following question:Able to eat and drink?  Patient answered yes to the following question:  Feeling worse overall?  [facility] EMMI follow up pending patient contact.     Plan:RNCM will send unsuccessful outreach letter, [facility] pamphlet, handout: Know Before You Go, will call patient for 2nd telephone outreach attempt within 4 business days, [facility] EMMI follow up, and proceed with case closure, within 10 business days if no return call.      Jasmine [name] H. Gardiner Barefoot, BSN, CCM [facility] Care Management [facility] Telephonic CM Phone: [phone] Fax: [phone]

## 2019-07-29 ENCOUNTER — Ambulatory Visit: Payer: Self-pay | Admitting: *Deleted

## 2019-07-29 DIAGNOSIS — R58 Hemorrhage, not elsewhere classified: Secondary | ICD-10-CM | POA: Diagnosis not present

## 2019-07-30 ENCOUNTER — Ambulatory Visit: Payer: Self-pay | Admitting: *Deleted

## 2019-08-03 ENCOUNTER — Encounter: Payer: Self-pay | Admitting: *Deleted

## 2019-08-03 ENCOUNTER — Other Ambulatory Visit: Payer: Self-pay | Admitting: *Deleted

## 2019-08-03 NOTE — Patient Outreach (Addendum)
[facility]) Care Management  08/03/2019  Jasmine [name] [birth year] [id number]   Subjective: Telephone call to patient's home number, spoke with patient, and HIPAA verified.  Discussed [facility] Care Management [employer] EMMI Stroke Red Flag Alert follow up, patient voiced understanding, and is in agreement to follow up.  States she remembers receiving EMMI automated calls and sometimes does not feel like talking.  Patient states she is doing much better, trying to get energy back, eating better, and family assisting with bringing meals to her as needed.  States when she initially came home was getting medications confused, currently  meds are being administered by her husband, and she is back on track with medication regimen.  States she also has a bruise and elevated area on her arm, consulted urgent care MD via video visit,  was advised that it was due to the blood pressure cuff, and being on blood thinner.   States she has discontinued use of blood pressure cuff while area is healing and will consult primary MD regarding when to resume taking home blood pressure.  Patient states she is aware of signs/ symptoms to report, how to reach provider if needed after hours, when to go to ED, and / or call 911.   Discussed importance of hospital follow up with primary MD, patient voices understanding, and states she will follow up as appropriate.   States the stroke was a wake up call for her, has not seen a primary MD in approximately 10 years, and has scheduled a hospital follow up appointment with new primary MD on 08/05/2019 to establish care.  States she also has a hospital follow up appointment with the neurologist on 08/25/2019.   Patient states she is able to manage some self care and has assistance as needed.  States she is accessing her [employer] benefits as needed via member services number on back of card.   Patient states she does not have any education material,  EMMI follow up, care coordination, care management, disease monitoring, transportation, community resource, or pharmacy needs at this time.  States she is very appreciative of the follow up, is in agreement  to receive 1 additional follow up call to assess for further CM needs, and is in agreement to receive [facility] Care Management EMMI follow up calls as needed.    Objective: Per KPN (Knowledge Performance Now, point of care tool) and chart review, patient hospitalized 07/17/2019 - 07/21/2019 for Hypertensive crisis/hypertensive emergency, Right basal ganglia infarct.    Patient also has a history of HLD and  tobacco dependence.    Assessment:   Received  [employer] EMMI Stroke [employer] Alert follow up referral on 07/27/2019.   Red Flag Alert Triggers, Day #3, times 2, patient answered no to the following question:Able to eat and drink?  Patient answered yes to the following question:  Feeling worse overall?   EMMI follow up completed and will follow up to assess further care management needs.     Plan:RNCM will call patient for  telephone outreach attempt within 21 business days, [facility] EMMI follow up, and proceed with case closure, within 10 business days if no return call, after 3rd unsuccessful outreach call.     Ellouise Mcwhirter H. Gardiner Barefoot, BSN, CCM [facility] Care Management [facility] Telephonic CM Phone: [phone] Fax: [phone]

## 2019-08-05 ENCOUNTER — Telehealth: Payer: Self-pay | Admitting: Cardiovascular Disease

## 2019-08-05 ENCOUNTER — Ambulatory Visit (INDEPENDENT_AMBULATORY_CARE_PROVIDER_SITE_OTHER): Payer: Medicare Other | Admitting: Family Medicine

## 2019-08-05 ENCOUNTER — Encounter: Payer: Self-pay | Admitting: Family Medicine

## 2019-08-05 ENCOUNTER — Other Ambulatory Visit: Payer: Self-pay

## 2019-08-05 VITALS — BP 133/57 | HR 71 | Temp 96.8°F | Resp 16 | Ht 60.0 in | Wt 99.2 lb

## 2019-08-05 DIAGNOSIS — K5900 Constipation, unspecified: Secondary | ICD-10-CM

## 2019-08-05 DIAGNOSIS — Z8673 Personal history of transient ischemic attack (TIA), and cerebral infarction without residual deficits: Secondary | ICD-10-CM | POA: Diagnosis not present

## 2019-08-05 DIAGNOSIS — F17210 Nicotine dependence, cigarettes, uncomplicated: Secondary | ICD-10-CM | POA: Diagnosis not present

## 2019-08-05 DIAGNOSIS — Z720 Tobacco use: Secondary | ICD-10-CM

## 2019-08-05 DIAGNOSIS — I10 Essential (primary) hypertension: Secondary | ICD-10-CM

## 2019-08-05 DIAGNOSIS — E782 Mixed hyperlipidemia: Secondary | ICD-10-CM | POA: Diagnosis not present

## 2019-08-05 DIAGNOSIS — T148XXA Other injury of unspecified body region, initial encounter: Secondary | ICD-10-CM

## 2019-08-05 DIAGNOSIS — R002 Palpitations: Secondary | ICD-10-CM | POA: Diagnosis not present

## 2019-08-05 DIAGNOSIS — E785 Hyperlipidemia, unspecified: Secondary | ICD-10-CM | POA: Insufficient documentation

## 2019-08-05 MED ORDER — HYDROCHLOROTHIAZIDE 25 MG PO TABS: 25.0000 mg | ORAL_TABLET | Freq: Every day | ORAL | 1 refills | Status: DC

## 2019-08-05 MED ORDER — ASPIRIN 81 MG PO TBEC: 81.0000 mg | DELAYED_RELEASE_TABLET | Freq: Every day | ORAL | 1 refills | Status: AC

## 2019-08-05 MED ORDER — AMLODIPINE BESYLATE 10 MG PO TABS: 10.0000 mg | ORAL_TABLET | Freq: Every day | ORAL | 1 refills | Status: DC

## 2019-08-05 MED ORDER — ATORVASTATIN CALCIUM 80 MG PO TABS: 80.0000 mg | ORAL_TABLET | Freq: Every day | ORAL | 1 refills | Status: DC

## 2019-08-05 MED ORDER — HYDRALAZINE HCL 50 MG PO TABS: 50.0000 mg | ORAL_TABLET | Freq: Three times a day (TID) | ORAL | 1 refills | Status: DC

## 2019-08-05 NOTE — Progress Notes (Signed)
Patient: Jasmine [name], Female    DOB: [birth year], 69 y.o.   MRN: [id number] Visit Date: 08/05/2019  Today's Provider: Shirlee Latch, MD   Chief Complaint  Patient presents with  . New Patient (Initial Visit)   Subjective:     Establish care Jasmine [name] is a 69 y.o. female who presents today to establish care. Patient was last seen here by Anola Gurney, PA-C on 08/02/2011.  No PCP since that time.   She was recently admitted to [facility] on 07/17/19 and discharged on 07/21/19.  She was treated for hypertensive emergency and R basal ganglia infarct.  She has no motor deficits, but does find she occasionally has some confusion or difficulty following commands.  Has follow-up with neurology in a few weeks. ----------------------------------------------------------------- HTN: - Medications: Amlodipine 10 mg daily, HCTZ 25 mg daily, hydralazine 4 times daily. - Compliance: Good, but wondering if hydralazine dose can be decreased to 3 times daily given difficulty arranging in her schedule IV times daily - Checking BP at home: Was initially, but not currently - Denies any SOB, CP, vision changes, LE edema, medication SEs, or symptoms of hypotension  L Wrist pain and swelling after taking BP on that wrist multiple times.  She had previously fractured this wrist and has a radial deviation of her hand as it was not surgically fixed.  She noticed significant bruising of this arm about 2 weeks ago.  She had a virtual urgent care visit the thought it was related to the repeated blood pressure measurements in the setting of dual antiplatelet therapy.  It is very slowly getting better, but there is a large lump that is still present that they are worried about.  Patient with longstanding constipation and external hemorrhoids intermittently.  Worse since her hospitalization and wonders if new medications may be contributing.  She is only having a bowel movement every 3 to 4  days.  Bowel movements are hard and she has some straining.  She does have some bleeding on the tissue after having a bowel movement, but no blood in the stool.  She has tried Colace with minimal relief.  She has added a stool in front of her toilet.  Patient and her husband report 2 episodes of feeling like her heart is going to beat out of her chest since her hospitalization.  These did not come on with exertion.  She is on telemetry throughout her hospital stay and had a normal echo, but was not sent home on a 30-day event monitor.  Smoking cut back significantly since hospitalization Down to about 2 cigs per day she does understand the importance of cessation after her recent stroke.  She does not want any medications to help her.  She believes that she can cut back and quit completely.  She has not had the cravings for them since her hospitalization.  Review of Systems  Constitutional: Positive for unexpected weight change.  HENT: Negative.   Eyes: Negative.   Respiratory: Negative.   Cardiovascular: Negative.   Gastrointestinal: Positive for blood in stool, constipation, diarrhea and rectal pain.  Endocrine: Negative.   Genitourinary: Negative.   Musculoskeletal: Negative.   Skin: Positive for color change and wound.  Allergic/Immunologic: Negative.   Neurological: Negative.   Hematological: Negative.   Psychiatric/Behavioral: Positive for confusion.    Social History      She  reports that she has been smoking cigarettes. She has been smoking about 1.00 pack per day. She  has never used smokeless tobacco. She reports previous alcohol use. She reports previous drug use.       Social History   Socioeconomic History  . Marital status: Married    Spouse name: Not on file  . Number of children: Not on file  . Years of education: Not on file  . Highest education level: Not on file  Occupational History  . Not on file  Tobacco Use  . Smoking status: Current Every Day Smoker     Packs/day: 1.00    Types: Cigarettes  . Smokeless tobacco: Never Used  Substance and Sexual Activity  . Alcohol use: Not Currently  . Drug use: Not Currently  . Sexual activity: Not on file  Other Topics Concern  . Not on file  Social History Narrative  . Not on file   Social Determinants of Health   Financial Resource Strain:   . Difficulty of Paying Living Expenses:   Food Insecurity:   . Worried About Programme researcher, broadcasting/film/videounning Out of Food in the Last Year:   . Baristaan Out of Food in the Last Year:   Transportation Needs:   . Freight forwarderLack of Transportation (Medical):   Marland Kitchen. Lack of Transportation (Non-Medical):   Physical Activity:   . Days of Exercise per Week:   . Minutes of Exercise per Session:   Stress:   . Feeling of Stress :   Social Connections:   . Frequency of Communication with Friends and Family:   . Frequency of Social Gatherings with Friends and Family:   . Attends Religious Services:   . Active Member of Clubs or Organizations:   . Attends BankerClub or Organization Meetings:   Marland Kitchen. Marital Status:     Past Medical History:  Diagnosis Date  . Hypertension      Patient Active Problem List   Diagnosis Date Noted  . 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 Essential hypertension   . Hypertensive emergency 07/17/2019    Past Surgical History:  Procedure Laterality Date  . TUBAL LIGATION  1981    Family History        Family Status  Relation Name Status  . Mother  Deceased  . Father  Deceased  . Sister  Deceased  . Brother  Deceased  . Daughter  Alive  . Brother  Deceased  . Daughter  Alive        Her family history includes Cancer in her father, mother, and sister.      No Known Allergies   Current Outpatient Medications:  .  amLODipine (NORVASC) 10 MG tablet, Take 1 tablet (10 mg total) by mouth daily., Disp: 30 tablet, Rfl: 1 .  aspirin EC 81 MG EC tablet, Take 1 tablet (81 mg total) by mouth daily., Disp: 90 tablet, Rfl: 1 .  atorvastatin  (LIPITOR) 80 MG tablet, Take 1 tablet (80 mg total) by mouth daily at 6 PM., Disp: 30 tablet, Rfl: 1 .  cholecalciferol (VITAMIN D3) 25 MCG (1000 UNIT) tablet, Take 1,000 Units by mouth daily., Disp: , Rfl:  .  clopidogrel (PLAVIX) 75 MG tablet, Take 1 tablet (75 mg total) by mouth daily., Disp: 21 tablet, Rfl: 0 .  hydrALAZINE (APRESOLINE) 50 MG tablet, Take 1 tablet (50 mg total) by mouth every 6 (six) hours., Disp: 120 tablet, Rfl: 1 .  hydrochlorothiazide (HYDRODIURIL) 25 MG tablet, Take 1 tablet (25 mg total) by mouth daily., Disp: 30 tablet, Rfl: 1 .  ibuprofen (ADVIL) 200 MG  tablet, Take 400 mg by mouth every 6 (six) hours as needed for headache or moderate pain., Disp: , Rfl:  .  Black Elderberry 50 MG/5ML SYRP, Take 15 mLs by mouth daily., Disp: , Rfl:  .  Multiple Vitamins-Minerals (MULTIVITAMIN WITH MINERALS) tablet, Take 1 tablet by mouth daily., Disp: , Rfl:  .  Probiotic Product (PROBIOTIC-10 ULTIMATE) CAPS, Take 1 capsule by mouth daily., Disp: , Rfl:    Patient Care Team: Erasmo Downer, MD as PCP - General (Family Medicine) Shelda Pal, RN as [facility] Care Management    Objective:    Vitals: BP (!) 133/57 (BP Location: Left Arm, Patient Position: Sitting, Cuff Size: Normal)   Pulse 71   Temp (!) 96.8 F (36 C) (Temporal)   Resp 16   Ht 5' (1.524 m)   Wt 99 lb 3.2 oz (45 kg)   BMI 19.37 kg/m    Vitals:   08/05/19 1007  BP: (!) 133/57  Pulse: 71  Resp: 16  Temp: (!) 96.8 F (36 C)  TempSrc: Temporal  Weight: 99 lb 3.2 oz (45 kg)  Height: 5' (1.524 m)     Physical Exam Vitals reviewed.  Constitutional:      General: She is not in acute distress.    Appearance: Normal appearance. She is well-developed. She is not diaphoretic.  HENT:     Head: Normocephalic and atraumatic.     Right Ear: External ear normal.     Left Ear: External ear normal.     Nose: Nose normal.     Mouth/Throat:     Mouth: Mucous membranes are moist.      Pharynx: Oropharynx is clear. No oropharyngeal exudate.  Eyes:     General: No scleral icterus.    Extraocular Movements: Extraocular movements intact.     Conjunctiva/sclera: Conjunctivae normal.     Pupils: Pupils are equal, round, and reactive to light.  Neck:     Thyroid: No thyromegaly.  Cardiovascular:     Rate and Rhythm: Normal rate and regular rhythm.     Pulses: Normal pulses.     Heart sounds: Normal heart sounds. No murmur.  Pulmonary:     Effort: Pulmonary effort is normal. No respiratory distress.     Breath sounds: Normal breath sounds. No wheezing or rales.  Abdominal:     General: There is no distension.     Palpations: Abdomen is soft.     Tenderness: There is no abdominal tenderness. There is no guarding or rebound.  Musculoskeletal:        General: No deformity.     Cervical back: Neck supple.     Right lower leg: No edema.     Left lower leg: No edema.     Comments: Hematoma and surrounding bruising of left forearm distally.  Mild tenderness to palpation.  No surrounding erythema.  No fluctuance.  Lymphadenopathy:     Cervical: No cervical adenopathy.  Skin:    General: Skin is warm and dry.     Capillary Refill: Capillary refill takes less than 2 seconds.     Findings: No rash.  Neurological:     Mental Status: She is alert and oriented to person, place, and time.     Cranial Nerves: No cranial nerve deficit.     Sensory: No sensory deficit.     Motor: No weakness.     Gait: Gait normal.     Deep Tendon Reflexes: Reflexes normal.  Comments: Finger-nose-finger intact.  Rapid alternating movements intact.  Speech is intact without any dysarthria.  She contributes to the conversation and answers appropriately.  She does have some hesitation with multistep commands  Psychiatric:        Mood and Affect: Mood normal.        Behavior: Behavior normal.        Thought Content: Thought content normal.      Depression Screen PHQ 2/9 Scores 08/05/2019  PHQ  - 2 Score 2  PHQ- 9 Score 11       Assessment & Plan:     Establish care  Exercise Activities and Dietary recommendations Goals   None     Immunization History  Administered Date(s) Administered  . Influenza, High Dose Seasonal PF 01/06/2018  . Td 02/02/2011  . Tdap 02/02/2011    Health Maintenance  Topic Date Due  . Hepatitis C Screening  Never done  . MAMMOGRAM  Never done  . COLONOSCOPY  Never done  . DEXA SCAN  Never done  . PNA vac Low Risk Adult (1 of 2 - PCV13) Never done  . INFLUENZA VACCINE  12/06/2018  . TETANUS/TDAP  02/01/2021     Discussed health benefits of physical activity, and encouraged her to engage in regular exercise appropriate for her age and condition.    --------------------------------------------------------------------  Problem List Items Addressed This Visit      Cardiovascular and Mediastinum   Essential hypertension - Primary    Well-controlled today Recent hospitalization for hypertensive emergency leading to CVA Discussed importance of good blood pressure control and regular follow-up to prevent complications Recheck metabolic panel given slight hypokalemia on day of discharge Continue current medications, but decrease hydralazine dose to 3 times daily instead Follow-up in 3 months      Relevant Medications   amLODipine (NORVASC) 10 MG tablet   aspirin 81 MG EC tablet   atorvastatin (LIPITOR) 80 MG tablet   hydrALAZINE (APRESOLINE) 50 MG tablet   hydrochlorothiazide (HYDRODIURIL) 25 MG tablet   Other Relevant Orders   Basic Metabolic Panel (BMET)     Other   History of CVA (cerebrovascular accident)    Hospitalization earlier this month for hypertensive emergency leading to right basal ganglia infarct Reviewed imaging and labs with the patient Normal echocardiogram No motor deficits, but residual slight confusion and following multistep commands Continue dual antiplatelet therapy, but at the end of 3 weeks from her  hospitalization, discontinue Plavix as discussed during hospitalization with neurology Continue aspirin Go to follow-up appointment with neurology Given the palpitations that she is experiencing currently, as below, we will also have her see cardiology as A. fib could be a possible cause for the stroke, though it is likely related to her hypertensive emergency      Relevant Orders   Ambulatory referral to Cardiology   Hyperlipidemia    Reviewed recent lipid panel Given recent stroke, goal LDL less than 70 Discussed diet and exercise Continue atorvastatin at current dose Recheck lipid panel at next visit in 3 months      Relevant Medications   amLODipine (NORVASC) 10 MG tablet   aspirin 81 MG EC tablet   atorvastatin (LIPITOR) 80 MG tablet   hydrALAZINE (APRESOLINE) 50 MG tablet   hydrochlorothiazide (HYDRODIURIL) 25 MG tablet   Palpitations    New problem 2 episodes of palpitations since hospitalization EKG is normal today Reviewed recent labs Check TSH Concern for possible paroxysmal A. fib, which could have contributed to her  recent stroke Likely needs a 30-day event monitor to capture these episodes Referral to cardiology today      Relevant Orders   Basic Metabolic Panel (BMET)   Ambulatory referral to Cardiology   TSH   EKG 12-Lead (Completed)   Constipation    Longstanding issue with recent worsening Discussed importance of increasing fiber in her diet Discussed importance of physical activity Discussed is common for this to worsen during hospitalization Trial of MiraLAX 1 capful daily and titrate to 1 soft bowel movement daily If any frank blood in her stool, will need to have colonoscopy We will discuss screening colonoscopy at next visit      Hematoma    New problem I do believe this is related to the repeat blood pressures done on that wrist in the setting of dual antiplatelet therapy Reassured patient and her husband that this is benign in nature, but  does take a long time for the body to resorb As she gets off of Plavix as below over the next week, this is likely to start improving more Discussed return precautions, including signs of infection      Tobacco abuse    Discussed importance of tobacco cessation, especially in the setting of recent stroke Patient has cut back significantly from 1 pack/day to 2 cigarettes/day since hospitalization Encouraged her to continue to cut back and quit entirely Discussed the health benefits of this We did discuss that there are medications available to help her quit, but patient feels as though she is smoking so little now that she will be able to do this independently without any medication therapy 3 to 5-minute discussion was had about these topics as well as the health risks of continued to smoke          Return in about 3 months (around 11/04/2019) for chronic disease f/u.   The entirety of the information documented in the History of Present Illness, Review of Systems and Physical Exam were personally obtained by me. Portions of this information were initially documented by Rondel Baton, CMA and reviewed by me for thoroughness and accuracy.    Crimson Beer, Marzella Schlein, MD MPH [facility]

## 2019-08-05 NOTE — Patient Instructions (Signed)
Constipation, Adult Constipation is when a person:  Poops (has a bowel movement) fewer times in a week than normal.  Has a hard time pooping.  Has poop that is dry, hard, or bigger than normal. Follow these instructions at home: Eating and drinking   Eat foods that have a lot of fiber, such as: ? Fresh fruits and vegetables. ? Whole grains. ? Beans.  Eat less of foods that are high in fat, low in fiber, or overly processed, such as: ? [place] fries. ? Hamburgers. ? Cookies. ? Candy. ? Soda.  Drink enough fluid to keep your pee (urine) clear or pale yellow. General instructions  Exercise regularly or as told by your doctor.  Go to the restroom when you feel like you need to poop. Do not hold it in.  Take over-the-counter and prescription medicines only as told by your doctor. These include any fiber supplements.  Do pelvic floor retraining exercises, such as: ? Doing deep breathing while relaxing your lower belly (abdomen). ? Relaxing your pelvic floor while pooping.  Watch your condition for any changes.  Keep all follow-up visits as told by your doctor. This is important. Contact a doctor if:  You have pain that gets worse.  You have a fever.  You have not pooped for 4 days.  You throw up (vomit).  You are not hungry.  You lose weight.  You are bleeding from the anus.  You have thin, pencil-like poop (stool). Get help right away if:  You have a fever, and your symptoms suddenly get worse.  You leak poop or have blood in your poop.  Your belly feels hard or bigger than normal (is bloated).  You have very bad belly pain.  You feel dizzy or you faint. This information is not intended to replace advice given to you by your health care provider. Make sure you discuss any questions you have with your health care provider. Document Revised: 04/05/2017 Document Reviewed: 10/12/2015 Elsevier Patient Education  [place]. Hematoma A hematoma is a  collection of blood. A hematoma can happen:  Under the skin.  In an organ.  In a body space.  In a joint space.  In other tissues. The blood can thicken (clot) to form a lump that you can see and feel. The lump is often hard and may become sore and tender. The lump can be very small or very big. Most hematomas get better in a few days to weeks. However, some hematomas may be serious and need medical care. What are the causes? This condition is caused by:  An injury.  Blood that leaks under the skin.  Problems from surgeries.  Medical conditions that cause bleeding or bruising. What increases the risk? You are more likely to develop this condition if:  You are an older adult.  You use medicines that thin your blood. What are the signs or symptoms? Symptoms depend on where the hematoma is in your body.  If the hematoma is under the skin, there is: ? A firm lump on the body. ? Pain and tenderness in the area. ? Bruising. The skin above the lump may be blue, dark blue, purple-red, or yellowish.  If the hematoma is deep in the tissues or body spaces, there may be: ? Blood in the stomach. This may cause pain in the belly (abdomen), weakness, passing out (fainting), and shortness of breath. ? Blood in the head. This may cause a headache, weakness, trouble speaking or understanding speech,  or passing out. How is this diagnosed? This condition is diagnosed based on:  Your medical history.  A physical exam.  Imaging tests, such as ultrasound or CT scan.  Blood tests. How is this treated? Treatment depends on the cause, size, and location of the hematoma. Treatment may include:  Doing nothing. Many hematomas go away on their own without treatment.  Surgery or close monitoring. This may be needed for large hematomas or hematomas that affect the body's organs.  Medicines. These may be given if a medical condition caused the hematoma. Follow these instructions at  home: Managing pain, stiffness, and swelling   If told, put ice on the area. ? Put ice in a plastic bag. ? Place a towel between your skin and the bag. ? Leave the ice on for 20 minutes, 2-3 times a day for the first two days.  If told, put heat on the affected area after putting ice on the area for two days. Use the heat source that your doctor tells you to use. This could be a moist heat pack or a heating pad. To do this: ? Place a towel between your skin and the heat source. ? Leave the heat on for 20-30 minutes. ? Remove the heat if your skin turns bright red. This is very important if you are unable to feel pain, heat, or cold. You may have a greater risk of getting burned.  Raise (elevate) the affected area above the level of your heart while you are sitting or lying down.  Wrap the affected area with an elastic bandage, if told by your doctor. Do not wrap the bandage too tightly.  If your hematoma is on a leg or foot and is painful, your doctor may give you crutches. Use them as told by your doctor. General instructions  Take over-the-counter and prescription medicines only as told by your doctor.  Keep all follow-up visits as told by your doctor. This is important. Contact a doctor if:  You have a fever.  The swelling or bruising gets worse.  You start to get more hematomas. Get help right away if:  Your pain gets worse.  Your pain is not getting better with medicine.  Your skin over the hematoma breaks or starts to bleed.  Your hematoma is in your chest or belly and you: ? Pass out. ? Feel weak. ? Become short of breath.  You have a hematoma on your scalp that is caused by a fall or injury, and you: ? Have a headache that gets worse. ? Have trouble speaking or understanding speech. ? Become less alert or you pass out. Summary  A hematoma is a collection of blood in any part of your body.  Most hematomas get better on their own in a few days to weeks. Some  may need medical care.  Follow instructions from your doctor about how to care for your hematoma.  Contact a doctor if the swelling or bruising gets worse, or if you are short of breath. This information is not intended to replace advice given to you by your health care provider. Make sure you discuss any questions you have with your health care provider. Document Revised: 09/26/2017 Document Reviewed: 09/26/2017 Elsevier Patient Education  2020 [employer].

## 2019-08-05 NOTE — Assessment & Plan Note (Signed)
New problem I do believe this is related to the repeat blood pressures done on that wrist in the setting of dual antiplatelet therapy Reassured patient and her husband that this is benign in nature, but does take a long time for the body to resorb As she gets off of Plavix as below over the next week, this is likely to start improving more Discussed return precautions, including signs of infection

## 2019-08-05 NOTE — Assessment & Plan Note (Signed)
Well-controlled today Recent hospitalization for hypertensive emergency leading to CVA Discussed importance of good blood pressure control and regular follow-up to prevent complications Recheck metabolic panel given slight hypokalemia on day of discharge Continue current medications, but decrease hydralazine dose to 3 times daily instead Follow-up in 3 months

## 2019-08-05 NOTE — Assessment & Plan Note (Signed)
Longstanding issue with recent worsening Discussed importance of increasing fiber in her diet Discussed importance of physical activity Discussed is common for this to worsen during hospitalization Trial of MiraLAX 1 capful daily and titrate to 1 soft bowel movement daily If any frank blood in her stool, will need to have colonoscopy We will discuss screening colonoscopy at next visit

## 2019-08-05 NOTE — Assessment & Plan Note (Signed)
Discussed importance of tobacco cessation, especially in the setting of recent stroke Patient has cut back significantly from 1 pack/day to 2 cigarettes/day since hospitalization Encouraged her to continue to cut back and quit entirely Discussed the health benefits of this We did discuss that there are medications available to help her quit, but patient feels as though she is smoking so little now that she will be able to do this independently without any medication therapy 3 to 5-minute discussion was had about these topics as well as the health risks of continued to smoke

## 2019-08-05 NOTE — Assessment & Plan Note (Signed)
New problem 2 episodes of palpitations since hospitalization EKG is normal today Reviewed recent labs Check TSH Concern for possible paroxysmal A. fib, which could have contributed to her recent stroke Likely needs a 30-day event monitor to capture these episodes Referral to cardiology today

## 2019-08-05 NOTE — Telephone Encounter (Signed)
Per Patient spouse visitor needed due to cognitive issues s/p cva

## 2019-08-05 NOTE — Assessment & Plan Note (Signed)
Hospitalization earlier this month for hypertensive emergency leading to right basal ganglia infarct Reviewed imaging and labs with the patient Normal echocardiogram No motor deficits, but residual slight confusion and following multistep commands Continue dual antiplatelet therapy, but at the end of 3 weeks from her hospitalization, discontinue Plavix as discussed during hospitalization with neurology Continue aspirin Go to follow-up appointment with neurology Given the palpitations that she is experiencing currently, as below, we will also have her see cardiology as A. fib could be a possible cause for the stroke, though it is likely related to her hypertensive emergency

## 2019-08-05 NOTE — Telephone Encounter (Signed)
That will be fine to have 1 visitor for the appointment

## 2019-08-05 NOTE — Telephone Encounter (Signed)
Per spouse patient does not

## 2019-08-05 NOTE — Assessment & Plan Note (Signed)
Reviewed recent lipid panel Given recent stroke, goal LDL less than 70 Discussed diet and exercise Continue atorvastatin at current dose Recheck lipid panel at next visit in 3 months

## 2019-08-06 ENCOUNTER — Telehealth: Payer: Self-pay

## 2019-08-06 ENCOUNTER — Telehealth: Payer: Self-pay | Admitting: Family Medicine

## 2019-08-06 DIAGNOSIS — E876 Hypokalemia: Secondary | ICD-10-CM

## 2019-08-06 LAB — BASIC METABOLIC PANEL
BUN/Creatinine Ratio: 12 (ref 12–28)
BUN: 7 mg/dL — ABNORMAL LOW (ref 8–27)
CO2: 24 mmol/L (ref 20–29)
Calcium: 10.2 mg/dL (ref 8.7–10.3)
Chloride: 88 mmol/L — ABNORMAL LOW (ref 96–106)
Creatinine, Ser: 0.58 mg/dL (ref 0.57–1.00)
GFR calc Af Amer: 110 mL/min/{1.73_m2} (ref >59)
GFR calc non Af Amer: 96 mL/min/{1.73_m2} (ref >59)
Glucose: 85 mg/dL (ref 65–99)
Potassium: 3.3 mmol/L — ABNORMAL LOW (ref 3.5–5.2)
Sodium: 129 mmol/L — ABNORMAL LOW (ref 134–144)

## 2019-08-06 LAB — TSH: TSH: 4.17 u[IU]/mL (ref 0.450–4.500)

## 2019-08-06 MED ORDER — POTASSIUM CHLORIDE CRYS ER 20 MEQ PO TBCR: 20.0000 meq | EXTENDED_RELEASE_TABLET | Freq: Every day | ORAL | 0 refills | Status: DC

## 2019-08-06 NOTE — Telephone Encounter (Signed)
Pt advised.  RX sent to [facility], and BMP ordered for two weeks.    Thanks,   -Vernona Rieger

## 2019-08-06 NOTE — Telephone Encounter (Signed)
-----   Message from Erasmo Downer, MD sent at 08/06/2019 10:03 AM EDT ----- Normal labs, except low potassium.  Start Kdur daily and recheck in 2 weeks. Ok to [employer] [facility][phone]

## 2019-08-06 NOTE — Telephone Encounter (Signed)
[facility] faxed refill request for the following medications:  Requesting 90 day supply of:  potassium chloride SA (KLOR-CON) 20 MEQ tablet   Please advise.  Thanks, [employer]

## 2019-08-06 NOTE — Telephone Encounter (Signed)
Spoke with patients daughter on phone, she states that she believed cardiologist office called. I informed her that referral was placed yesterday and forwarded to Dr. Ethelene Hal office, I gave her contact information for cardiologist to inquire about appointment. KW  Copied from CRM [id number]. Topic: General - Inquiry >> Aug 05, 2019  4:26 PM Leary Roca wrote: Reason for CRM: Patient daughter called in to get appt sch date for Cardiologist appt for pt . Please call daughter back [employer]   Call back [phone]

## 2019-08-10 ENCOUNTER — Other Ambulatory Visit: Payer: Self-pay

## 2019-08-10 ENCOUNTER — Ambulatory Visit (INDEPENDENT_AMBULATORY_CARE_PROVIDER_SITE_OTHER): Payer: Medicare Other | Admitting: Cardiovascular Disease

## 2019-08-10 ENCOUNTER — Encounter: Payer: Self-pay | Admitting: Cardiovascular Disease

## 2019-08-10 VITALS — BP 124/66 | HR 62 | Ht 60.0 in | Wt 97.2 lb

## 2019-08-10 DIAGNOSIS — E782 Mixed hyperlipidemia: Secondary | ICD-10-CM | POA: Diagnosis not present

## 2019-08-10 DIAGNOSIS — R002 Palpitations: Secondary | ICD-10-CM | POA: Diagnosis not present

## 2019-08-10 DIAGNOSIS — Z720 Tobacco use: Secondary | ICD-10-CM

## 2019-08-10 DIAGNOSIS — Z8673 Personal history of transient ischemic attack (TIA), and cerebral infarction without residual deficits: Secondary | ICD-10-CM | POA: Diagnosis not present

## 2019-08-10 DIAGNOSIS — T148XXA Other injury of unspecified body region, initial encounter: Secondary | ICD-10-CM

## 2019-08-10 DIAGNOSIS — I10 Essential (primary) hypertension: Secondary | ICD-10-CM | POA: Diagnosis not present

## 2019-08-10 MED ORDER — LOSARTAN POTASSIUM 100 MG PO TABS: 100.0000 mg | ORAL_TABLET | Freq: Every day | ORAL | 3 refills | Status: DC

## 2019-08-10 NOTE — Patient Instructions (Addendum)
Medication Instructions:  Your physician has recommended you make the following change in your medication:   1. HOLD Hydrochlorothiazide  2. HOLD Potassium 3. START Losartan 100 mg (start losartan 1/2 pill daily 50 mg)  Monitor blood pressure If blood pressure runs high >140, we might need a whole pill (100 mg)  Call with BP numbers  If you need a refill on your cardiac medications before your next appointment, please call your pharmacy.    Lab work: No new labs needed   If you have labs (blood work) drawn today and your tests are completely normal, you will receive your results only by: Marland Kitchen MyChart Message (if you have MyChart) OR . A paper copy in the mail If you have any lab test that is abnormal or we need to change your treatment, we will call you to review the results.   Testing/Procedures: No new testing needed   Follow-Up: At [facility], you and your health needs are our priority.  As part of our continuing mission to provide you with exceptional heart care, we have created designated Provider Care Teams.  These Care Teams include your primary Cardiologist (physician) and Advanced Practice Providers (APPs -  Physician Assistants and Nurse Practitioners) who all work together to provide you with the care you need, when you need it.  . You will need a follow up appointment in 3 months   . Providers on your designated Care Team:   . Nicolasa Ducking, NP . Eula Listen, PA-C . Marisue Ivan, PA-C  Any Other Special Instructions Will Be Listed Below (If Applicable).  For educational health videos Log in to : [email or web address] Or : [email or web address], password : [email or web address]

## 2019-08-10 NOTE — Progress Notes (Signed)
Cardiology Office Note  Date:  08/10/2019   ID:  Jasmine [name], DOB [birth year], MRN [id number]  PCP:  Virginia Crews, MD   Chief Complaint  Patient presents with  . New Patient (Initial Visit)    Pt referred by PCP stated heart beating too fast. Hx of stroke. Pt has a bruised knot on Right arm from BP cuff around- (07/23/19) pt states does had a Hx of broken Right arm. Meds verbally reviewed w/ pt.    HPI:  Jasmine [name] is a 69 year old woman with past medical history of Chronic Hypertensive urgency Right basal ganglia infarct Smoker Referred to cardiology by primary care for consultation of history of stroke  Admitted to the hospital March 2021 with hypertensive emergency left-sided weakness dysarthria when getting out of bed Blood pressure 230/108 MRI acute right basal ganglia infarct Neurology was consulted and felt constellation of findings were more consistent with hypertensive encephalopathy  started on cleviprex along with amlodipine and HCTZ.  Overnight 3/14, cleviprex was weaned off   She was instructed by neurology to take aspirin Plavix for 3 weeks then stop Plavix and continue aspirin indefinitely  Echocardiogram performed July 18, 2019 Ejection fraction 60 to 65%, moderate LVH Grade 1 diastolic dysfunction  CT scan Atherosclerotic plaque  affecting both common carotid arteries but without measurable stenosis. Soft and calcified plaque at both carotid bifurcations and ICA bulbs but without measurable stenosis. No posterior circulation pathology. emphysema.  Still not driving, Mind is more clear Some confusion  Large hematoma left forearm, from blood  Slowly getting better   Recent lab work reviewed sodium dropping from 140 down to 129 in the past month Drop in potassium, recently started on potassium supplement  EKG personally reviewed by myself on todays visit Shows normal sinus rhythm rate 62 bpm no significant ST-T wave  changes   PMH:   has a past medical history of Basal ganglia stroke ([place]), Hypertension, Hypertensive emergency, and Left wrist fracture.  PSH:    Past Surgical History:  Procedure Laterality Date  . TUBAL LIGATION  1981    Current Outpatient Medications  Medication Sig Dispense Refill  . amLODipine (NORVASC) 10 MG tablet Take 1 tablet (10 mg total) by mouth daily. 90 tablet 1  . aspirin 81 MG EC tablet Take 1 tablet (81 mg total) by mouth daily. 90 tablet 1  . atorvastatin (LIPITOR) 80 MG tablet Take 1 tablet (80 mg total) by mouth daily at 6 PM. 90 tablet 1  . cholecalciferol (VITAMIN D3) 25 MCG (1000 UNIT) tablet Take 1,000 Units by mouth daily.    . hydrALAZINE (APRESOLINE) 50 MG tablet Take 1 tablet (50 mg total) by mouth 3 (three) times daily. 270 tablet 1  . losartan (COZAAR) 100 MG tablet Take 1 tablet (100 mg total) by mouth daily. 90 tablet 3  . Probiotic Product (PROBIOTIC-10 ULTIMATE) CAPS Take 1 capsule by mouth daily.     No current facility-administered medications for this visit.     Allergies:   Patient has no known allergies.   Social History:  The patient  reports that she has been smoking cigarettes. She has a 55.00 pack-year smoking history. She has never used smokeless tobacco. She reports previous alcohol use. She reports previous drug use.   Family History:   family history includes Cancer in her father, mother, and sister.    Review of Systems: Review of Systems  Constitutional: Negative.   HENT: Negative.   Respiratory: Negative.   Cardiovascular:  Negative.   Gastrointestinal: Negative.   Musculoskeletal: Negative.   Neurological: Negative.   Psychiatric/Behavioral: Negative.   All other systems reviewed and are negative.   PHYSICAL EXAM: VS:  BP 124/66 (BP Location: Right Arm, Patient Position: Sitting, Cuff Size: Normal)   Pulse 62   Ht 5' (1.524 m)   Wt 97 lb 4 oz (44.1 kg)   SpO2 98%   BMI 18.99 kg/m  , BMI Body mass index is 18.99  kg/m. GEN: Well nourished, well developed, in no acute distress HEENT: normal Neck: no JVD, carotid bruits, or masses Cardiac: RRR; no murmurs, rubs, or gallops,no edema  Respiratory:  clear to auscultation bilaterally, normal work of breathing GI: soft, nontender, nondistended, + BS MS: no deformity or atrophy Skin: warm and dry, no rash Neuro:  Strength and sensation are intact Psych: euthymic mood, full affect   Recent Labs: 07/17/2019: B Natriuretic Peptide 140.7 07/18/2019: Magnesium 1.9 07/21/2019: Hemoglobin 14.6; Platelets 333 08/05/2019: BUN 7; Creatinine, Ser 0.58; Potassium 3.3; Sodium 129; TSH 4.170    Lipid Panel Lab Results  Component Value Date   CHOL 235 (H) 07/19/2019   HDL 49 07/19/2019   LDLCALC 158 (H) 07/19/2019   TRIG 141 07/19/2019   TRIG 138 07/19/2019      Wt Readings from Last 3 Encounters:  08/10/19 97 lb 4 oz (44.1 kg)  08/05/19 99 lb 3.2 oz (45 kg)  07/21/19 107 lb 9.4 oz (48.8 kg)      ASSESSMENT AND PLAN:  Problem List Items Addressed This Visit      Cardiology Problems   Essential hypertension   Relevant Medications   losartan (COZAAR) 100 MG tablet   Other Relevant Orders   EKG 12-Lead   Hyperlipidemia   Relevant Medications   losartan (COZAAR) 100 MG tablet     Other   History of CVA (cerebrovascular accident) - Primary   Relevant Orders   EKG 12-Lead   Palpitations   Hematoma   Tobacco abuse     Hypertension/stroke Discussed recent hospital course Because of stroke felt secondary to chronic poorly controlled hypertension Moderate LVH on echocardiogram consistent with chronic hypertension She is monitoring blood pressure at home, well controlled Major issues with her drop in sodium, drop in potassium on HCTZ Also bothered by the 3 times daily dosing of hydralazine We have recommended she stopped HCTZ and potassium Reports that she has lab work checked with primary care in the next 2 weeks We will start losartan 50 mg  daily Check pressure, if running high more than 140 systolic we will need to increase losartan up to 100 daily Would like to increase losartan if possible and cut back on her hydralazine  Smoking We have encouraged her to continue to work on weaning her cigarettes and smoking cessation. She will continue to work on this and does not want any assistance with chantix.   Hyperlipidemia On statin  Hematoma left forearm Recommend a gentle wrap to protect against bumping She is now off of Plavix Stay on low-dose aspirin  Disposition:   F/U  12 months   Total encounter time more than 60 minutes  Greater than 50% was spent in counseling and coordination of care with the patient    Signed, Dossie Arbour, M.D., Ph.D. [facility] [place], [place] [phone]

## 2019-08-19 ENCOUNTER — Telehealth: Payer: Self-pay | Admitting: Cardiovascular Disease

## 2019-08-19 NOTE — Telephone Encounter (Signed)
Patient calling to report blood pressure readings from today R wrist with cuff 99/66 4:40     94/61

## 2019-08-20 ENCOUNTER — Telehealth: Payer: Self-pay | Admitting: Cardiovascular Disease

## 2019-08-20 NOTE — Telephone Encounter (Signed)
See other telephone encounter. Duplicate entry

## 2019-08-20 NOTE — Telephone Encounter (Signed)
Spoke with patient and she is still only taking 1/2 tablet of Losartan 100 mg. She states that her husband bought a new blood pressure cuff and those previous readings that were called in they were not using it correctly. She reports her daughter came over and showed her the correct way to take her blood pressure with the new machine. Today her pressure at 10:30 AM was 113/64 and at 1:40 PM it was 134/66. Instructed her to monitor her blood pressures over the next 2 weeks twice a day and to call them back in for [place] to see how her pressures are trending. Reviewed that we don't want it to stay persistently 140/80 or higher and we also want to know if it low. She verbalized understanding of our conversation, agreement with plan, and had no further questions at this time.

## 2019-08-20 NOTE — Telephone Encounter (Signed)
22 hours ago (4:47 PM)  TB   Patient calling to report blood pressure readings from today R wrist with cuff 99/66 4:40                    94/61

## 2019-08-20 NOTE — Telephone Encounter (Signed)
Patient is calling with BP readings: 4/15- 2 hrs after medication 10:30 am -113/64, 10:33 am -91/54

## 2019-08-25 ENCOUNTER — Encounter: Payer: Self-pay | Admitting: Adult Health

## 2019-08-25 ENCOUNTER — Ambulatory Visit: Payer: Medicare Other | Admitting: Adult Health

## 2019-08-25 ENCOUNTER — Other Ambulatory Visit: Payer: Self-pay

## 2019-08-25 VITALS — BP 134/69 | HR 71 | Ht 60.0 in | Wt 99.0 lb

## 2019-08-25 DIAGNOSIS — Z720 Tobacco use: Secondary | ICD-10-CM

## 2019-08-25 DIAGNOSIS — E785 Hyperlipidemia, unspecified: Secondary | ICD-10-CM | POA: Diagnosis not present

## 2019-08-25 DIAGNOSIS — I639 Cerebral infarction, unspecified: Secondary | ICD-10-CM

## 2019-08-25 DIAGNOSIS — I10 Essential (primary) hypertension: Secondary | ICD-10-CM

## 2019-08-25 DIAGNOSIS — I6381 Other cerebral infarction due to occlusion or stenosis of small artery: Secondary | ICD-10-CM

## 2019-08-25 NOTE — Patient Instructions (Signed)
Continue aspirin 81 mg daily  and atorvastatin 80 mg daily for secondary stroke prevention  Continue to follow up with PCP regarding cholesterol and blood pressure management   Recommend memory exercises such as cross word puzzles, word search, suduko, reading and card games to help with short term memory concerns  Continue to monitor blood pressure at home  Maintain strict control of hypertension with blood pressure goal below 130/90, diabetes with hemoglobin A1c goal below 6.5% and cholesterol with LDL cholesterol (bad cholesterol) goal below 70 mg/dL. I also advised the patient to eat a healthy diet with plenty of whole grains, cereals, fruits and vegetables, exercise regularly and maintain ideal body weight.  Followup in the future with me in 3 months or call earlier if needed       Thank you for coming to see [place] at [facility]. I hope we have been able to provide you high quality care today.  You may receive a patient satisfaction survey over the next few weeks. We would appreciate your feedback and comments so that we may continue to improve ourselves and the health of our patients.

## 2019-08-25 NOTE — Progress Notes (Signed)
[facility] [address] [place]. [place] [address] [phone]       HOSPITAL FOLLOW UP NOTE  Ms. TAEJA DEBELLIS Date of Birth:  08/17/1950 Medical Record Number:  [id number]   Reason for Referral:  hospital stroke follow up    SUBJECTIVE:   CHIEF COMPLAINT:  Chief Complaint  Patient presents with  . New Patient (Initial Visit)    RM 9, with husband. Follow up from stroke. Watching her BP.    HPI:   Ms. BRAYLEIGH RYBACKI is a 69 y.o. female with history of hypertension, tobacco use, and previous substance abuse who presented on 07/17/2019 with episodic left sided weakness and elevated BP 230/108.   Stroke work-up revealed acute right basal ganglia infarction secondary to small vessel disease and medical noncompliance.  Recommended DAPT for 3 weeks and Plavix alone as previously on aspirin.  HTN stabilized during admission with long-term BP goal normotensive range.  LDL 158 initiate atorvastatin 80 mg daily.  Other stroke risk factors include current tobacco use, prior EtOH use and prior substance abuse.  Discharged home in stable condition without therapy needs.  Stroke:  Acute right basal ganglia infarction -  small vessel disease and medical noncompliance.  Resultant  Symptoms resolved  Code Stroke CT Head - No acute finding. Aspects 10. 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   CTA H&N - no high grade stenosis  MRI head - Acute right basal ganglia infarction.   2D Echo - EF 60 - 65%. No cardiac source of emboli identified.  Sars Corona Virus 2 - negative  LDL - 158 -initiated atorvastatin 80 mg daily  HgbA1c - 5.3  UDS - benzodiazepine  VTE prophylaxis - [place] Heparin  Not on antithrombotic prior to stroke, now on aspirin 81 and plavix 75. Continue DAPT x 3 weeks then aspirin alone.   Therapy recommendations:  None  Disposition:    Home  Today, 08/25/2019, Ms. Wanat is being seen for hospital follow-up.  She has been stable from a stroke standpoint with residual mild cognitive impairment and occasional word finding difficulty/hesitancy but overall improving per husband.  Completed 3 weeks DAPT and continues on aspirin without bleeding or bruising.  She denies use of aspirin prior to stroke.  Continues on atorvastatin for secondary stroke prevention side effects.  Blood pressure today 134/69.  Tobacco use has decreased - previously smoking 1.5-2 packs per day and now less than 1 pack per day. She continues to decrease amount.  No further concerns at this time.      ROS:   14 system review of systems performed and negative with exception of short-term memory loss  PMH:  Past Medical History:  Diagnosis Date  . Basal ganglia stroke (HCC)   . Hypertension   . Hypertensive emergency   . Left wrist fracture     PSH:  Past Surgical History:  Procedure Laterality Date  . TUBAL LIGATION  1981    Social History:  Social History   Socioeconomic History  . Marital status: Married    Spouse name: Not on file  . Number of children: 2  . Years of education: Not on file  . Highest education level: Not on file  Occupational History  . Occupation: homemaker  Tobacco Use  . Smoking status: Current Every Day Smoker    Packs/day: 1.00    Years: 55.00    Pack years: 55.00  Types: Cigarettes  . Smokeless tobacco: Never Used  Substance and Sexual Activity  . Alcohol use: Not Currently  . Drug use: Not Currently  . Sexual activity: Not Currently  Other Topics Concern  . Not on file  Social History Narrative  . Not on file   Social Determinants of Health   Financial Resource Strain:   . Difficulty of Paying Living Expenses:   Food Insecurity:   . Worried About Programme researcher, broadcasting/film/video in the Last Year:   . Barista in the Last Year:   Transportation Needs:   . Freight forwarder (Medical):   Marland Kitchen  Lack of Transportation (Non-Medical):   Physical Activity:   . Days of Exercise per Week:   . Minutes of Exercise per Session:   Stress:   . Feeling of Stress :   Social Connections:   . Frequency of Communication with Friends and Family:   . Frequency of Social Gatherings with Friends and Family:   . Attends Religious Services:   . Active Member of Clubs or Organizations:   . Attends Banker Meetings:   Marland Kitchen Marital Status:   Intimate Partner Violence:   . Fear of Current or Ex-Partner:   . Emotionally Abused:   Marland Kitchen Physically Abused:   . Sexually Abused:     Family History:  Family History  Problem Relation Age of Onset  . Cancer Mother   . Cancer Father   . Cancer Sister        throat cancer    Medications:   Current Outpatient Medications on File Prior to Visit  Medication Sig Dispense Refill  . amLODipine (NORVASC) 10 MG tablet Take 1 tablet (10 mg total) by mouth daily. 90 tablet 1  . aspirin 81 MG EC tablet Take 1 tablet (81 mg total) by mouth daily. 90 tablet 1  . atorvastatin (LIPITOR) 80 MG tablet Take 1 tablet (80 mg total) by mouth daily at 6 PM. 90 tablet 1  . cholecalciferol (VITAMIN D3) 25 MCG (1000 UNIT) tablet Take 1,000 Units by mouth daily.    . hydrALAZINE (APRESOLINE) 50 MG tablet Take 1 tablet (50 mg total) by mouth 3 (three) times daily. 270 tablet 1  . Probiotic Product (PROBIOTIC-10 ULTIMATE) CAPS Take 1 capsule by mouth daily.     No current facility-administered medications on file prior to visit.    Allergies:  No Known Allergies    OBJECTIVE:  Vitals  Vitals:   08/25/19 1354  BP: 134/69  Pulse: 71  Weight: 99 lb (44.9 kg)  Height: 5' (1.524 m)   Body mass index is 19.33 kg/m. No exam data present  Physical exam  General: well developed, well nourished, seated, in no evident distress Head: head normocephalic and atraumatic.   Neck: supple with no carotid or supraclavicular bruits Cardiovascular: regular rate and  rhythm, no murmurs Musculoskeletal: no deformity Skin:  no rash/petichiae Vascular:  Normal pulses all extremities   Neurologic Exam Mental Status: Awake and fully alert.   Mild dysarthria with speech hesitancy.  Oriented to place and time. Recent and remote memory intact. Attention span, concentration and fund of knowledge appropriate. Mood and affect appropriate.  Recall 2/3.  Animal naming 8 in 60 seconds.  Clock drawing 4/4.  Okay with serial additions. Cranial Nerves: Fundoscopic exam reveals sharp disc margins. Pupils equal, briskly reactive to light. Extraocular movements full without nystagmus. Visual fields full to confrontation. Hearing intact. Facial sensation intact. Face, tongue, palate moves  normally and symmetrically.  Motor: Normal bulk and tone. Normal strength in all tested extremity muscles. Sensory.: intact to touch , pinprick , position and vibratory sensation.  Coordination: Rapid alternating movements normal in all extremities. Finger-to-nose and heel-to-shin performed accurately bilaterally. Gait and Station: Arises from chair without difficulty. Stance is normal. Gait demonstrates normal stride length and balance Reflexes: 1+ and symmetric. Toes downgoing.     NIHSS  1 Modified Rankin  2    ASSESSMENT: NELMA PHAGAN is a 69 y.o. year old female presented with episodic left-sided weakness and elevated BP 230/108 on 07/17/2019 with stroke work-up revealing acute right basal ganglia infarction secondary to small vessel disease and medical noncompliance. Vascular risk factors include HTN, HLD, tobacco use and prior substance abuse. Residual deficits of mild dysarthria with speech hesitancy and cognitive impairment but overall greatly improving.     PLAN:  1. Right BG stroke: Continue aspirin 81 mg daily  and atorvastatin 80 mg daily for secondary stroke prevention. Neurology note during admission stated patient previously on aspirin but she declines routine use  prior to stroke therefore okay to continue on aspirin and no indication for ongoing use of Plavix. Maintain strict control of hypertension with blood pressure goal below 130/90, diabetes with hemoglobin A1c goal below 6.5% and cholesterol with LDL cholesterol (bad cholesterol) goal below 70 mg/dL.  I also advised the patient to eat a healthy diet with plenty of whole grains, cereals, fruits and vegetables, exercise regularly with at least 30 minutes of continuous activity daily and maintain ideal body weight. 2. HTN: Stable. Continue to follow with PCP for monitoring and management 3. HLD: Continuation of atorvastatin 80 mg daily and advised to follow-up with PCP for ongoing prescribing, monitoring and management 4. Tobacco use: Continues to decrease daily use and encouraged to continue to cut back until she quits entirely. She is aware of increased stroke risk with ongoing tobacco use and other associated health risks.    Follow up in 3 months or call earlier if needed   I spent 45 minutes of face-to-face and non-face-to-face time with patient and husband.  This included previsit chart review, lab review, study review, order entry, electronic health record documentation, patient education regarding recent stroke, residual deficits, importance of managing stroke risk factors and answered all questions to patient satisfaction     Frann Rider, [facility]  [facility] [address] [place], [place] [address]  Phone [phone] Fax [phone] Note: This document was prepared with digital dictation and possible smart phrase technology. Any transcriptional errors that result from this process are unintentional.

## 2019-08-25 NOTE — Progress Notes (Signed)
I agree with the above plan 

## 2019-08-27 ENCOUNTER — Other Ambulatory Visit: Payer: Self-pay | Admitting: *Deleted

## 2019-08-27 NOTE — Patient Outreach (Signed)
[facility]) Care Management  08/27/2019  Jasmine [name] Dec 02, 1950 [id number]   Subjective: Telephone call to patient's home number, spoke with patient, and HIPAA verified.  States she remembers speaking with this RNCM in the past.  States she is doing pretty good, had follow up appointment with neurologist on 08/25/2019, and appointment went well.  States providers stated that she is recovering well from the stroke, doing great, slow speech at times, and is a work in progress overall.  Patient states her next follow up appointment will be in 3 months.  Patient states she does not have any education material, EMMI follow up, care coordination, care management, disease monitoring, transportation, community resource, or pharmacy needs at this time.  States she is very appreciative of the follow up, no further CM needs, and is in agreement to receive [facility] Care Management EMMI follow up calls as needed.     Objective:Per KPN (Knowledge Performance Now, point of care tool) and chart review,patient hospitalized 07/17/2019 - 07/21/2019 forHypertensive crisis/hypertensive emergency,Right basal ganglia infarct. Patient also has a history of HLD and tobacco dependence.    Assessment: Received [employer] EMMI Stroke [employer] Alert follow up referral on 07/27/2019. Red Flag Alert Triggers, Day #3, times 2, patient answered no to the following question:Able to eat and drink?  Patient answered yes to the following question: Feeling worse overall?  EMMI follow up completed and no further care management needs.     Plan: RNCM will complete case closure due to follow up completed / no care management needs.      Mirel Hundal H. Gardiner Barefoot, BSN, CCM [facility] Care Management [facility] Telephonic CM Phone: [phone] Fax: [phone]

## 2019-08-30 NOTE — Telephone Encounter (Signed)
Blood pressure looks good.  

## 2019-09-14 ENCOUNTER — Other Ambulatory Visit: Payer: Self-pay | Admitting: Family Medicine

## 2019-09-14 NOTE — Telephone Encounter (Signed)
Pt is longer taking Potassium.  She states Dr. Mariah Milling took her off of it.   Thanks,   -Vernona Rieger

## 2019-09-14 NOTE — Telephone Encounter (Signed)
[facility] faxed refill request for the following medications:  Came in on Fax: POTASSIUM CL ER TABLETS  On Pt Chart: potassium chloride SA (KLOR-CON) 20 MEQ tablet [[id number]] DISCONTINUED     Please advise.  Thanks, [employer]

## 2019-09-23 ENCOUNTER — Other Ambulatory Visit: Payer: Self-pay

## 2019-09-23 ENCOUNTER — Ambulatory Visit: Payer: Medicare Other

## 2019-09-23 ENCOUNTER — Telehealth (INDEPENDENT_AMBULATORY_CARE_PROVIDER_SITE_OTHER): Payer: Medicare Other | Admitting: Family Medicine

## 2019-09-23 ENCOUNTER — Telehealth: Payer: Self-pay

## 2019-09-23 DIAGNOSIS — K047 Periapical abscess without sinus: Secondary | ICD-10-CM

## 2019-09-23 MED ORDER — TRAMADOL HCL 50 MG PO TABS: 50.0000 mg | ORAL_TABLET | Freq: Three times a day (TID) | ORAL | 0 refills | Status: AC | PRN

## 2019-09-23 MED ORDER — AMOXICILLIN-POT CLAVULANATE 875-125 MG PO TABS: 1.0000 | ORAL_TABLET | Freq: Two times a day (BID) | ORAL | 0 refills | Status: AC

## 2019-09-23 NOTE — Telephone Encounter (Signed)
Please advise 

## 2019-09-23 NOTE — Telephone Encounter (Signed)
Could she do a virtual visit today so I could look at it?

## 2019-09-23 NOTE — Progress Notes (Signed)
MyChart Video Visit    Virtual Visit via Video Note   This visit type was conducted due to national recommendations for restrictions regarding the COVID-19 Pandemic (e.g. social distancing) in an effort to limit this patient's exposure and mitigate transmission in our community. This patient is at least at moderate risk for complications without adequate follow up. This format is felt to be most appropriate for this patient at this time. Physical exam was limited by quality of the video and audio technology used for the visit.    Patient location: home Provider location: [facility] Persons involved in the visit: patient, provider   Patient: Jasmine [name]   DOB: 1950/05/19   69 y.o. Female  MRN: [id number] Visit Date: 09/23/2019  Today's healthcare provider: Shirlee Latch, MD   Chief Complaint  Patient presents with  . Dental Pain   Subjective    Dental Pain  This is a new problem. The current episode started yesterday. The problem occurs constantly. The problem has been gradually worsening. Pertinent negatives include no difficulty swallowing, fever, oral bleeding or sinus pressure. The treatment provided no relief.   Upcoming appt with dentist next week - had to be postponed due to husband's emergency surgery  Patient Active Problem List   Diagnosis Date Noted  . Hyperlipidemia 08/05/2019  . Palpitations 08/05/2019  . Constipation 08/05/2019  . Hematoma 08/05/2019  . Tobacco abuse 08/05/2019  . History of CVA (cerebrovascular accident)   . Essential hypertension    Social History   Tobacco Use  . Smoking status: Current Every Day Smoker    Packs/day: 1.00    Years: 55.00    Pack years: 55.00    Types: Cigarettes  . Smokeless tobacco: Never Used  Substance Use Topics  . Alcohol use: Not Currently  . Drug use: Not Currently   No Known Allergies  Medications: Outpatient Medications Prior to Visit  Medication Sig  . amLODipine  (NORVASC) 10 MG tablet Take 1 tablet (10 mg total) by mouth daily.  Marland Kitchen aspirin 81 MG EC tablet Take 1 tablet (81 mg total) by mouth daily.  Marland Kitchen atorvastatin (LIPITOR) 80 MG tablet Take 1 tablet (80 mg total) by mouth daily at 6 PM.  . cholecalciferol (VITAMIN D3) 25 MCG (1000 UNIT) tablet Take 1,000 Units by mouth daily.  . hydrALAZINE (APRESOLINE) 50 MG tablet Take 1 tablet (50 mg total) by mouth 3 (three) times daily.  . Probiotic Product (PROBIOTIC-10 ULTIMATE) CAPS Take 1 capsule by mouth daily.   No facility-administered medications prior to visit.    Review of Systems  Constitutional: Negative.  Negative for fever.  HENT: Positive for dental problem. Negative for congestion, drooling, ear discharge, ear pain, facial swelling, hearing loss, mouth sores, nosebleeds, postnasal drip, rhinorrhea, sinus pressure, sinus pain, sneezing, sore throat, tinnitus, trouble swallowing and voice change.       Objective    There were no vitals taken for this visit.   Physical Exam Constitutional:      General: She is not in acute distress.    Appearance: Normal appearance. She is not diaphoretic.  HENT:     Head: Normocephalic and atraumatic.     Mouth/Throat:     Comments: Poor dentition.  Left upper canine tooth is loose with partial root visible.  Erosion of gumline with abscess Pulmonary:     Effort: Pulmonary effort is normal. No respiratory distress.  Neurological:     Mental Status: She is alert and oriented to  person, place, and time. Mental status is at baseline.  Psychiatric:        Mood and Affect: Mood normal.        Behavior: Behavior normal.        Assessment & Plan     1. Dental abscess -New problem -Able to visualize left upper canine with erosion of the gumline with partial root visible and abscess present -Discussed importance of seeing dentist for upcoming appointment -We will start Augmentin for the abscess -No systemic symptoms, but discussed red flag symptoms  and need for reevaluation -Tramadol as needed for pain -Can continue Tylenol/ibuprofen as well -Return precautions discussed   Meds ordered this encounter  Medications  . amoxicillin-clavulanate (AUGMENTIN) 875-125 MG tablet    Sig: Take 1 tablet by mouth 2 (two) times daily for 7 days.    Dispense:  14 tablet    Refill:  0  . traMADol (ULTRAM) 50 MG tablet    Sig: Take 1 tablet (50 mg total) by mouth every 8 (eight) hours as needed for up to 5 days.    Dispense:  15 tablet    Refill:  0     Return if symptoms worsen or fail to improve.     I discussed the assessment and treatment plan with the patient. The patient was provided an opportunity to ask questions and all were answered. The patient agreed with the plan and demonstrated an understanding of the instructions.   The patient was advised to call back or seek an in-person evaluation if the symptoms worsen or if the condition fails to improve as anticipated.  I, Lavon Paganini, MD, have reviewed all documentation for this visit. The documentation on 09/23/19 for the exam, diagnosis, procedures, and orders are all accurate and complete.   Haddy Mullinax, Dionne Bucy, MD, MPH [facility]

## 2019-09-23 NOTE — Telephone Encounter (Signed)
Copied from CRM [id number]. Topic: General - Other >> Sep 23, 2019  9:02 AM Jaquita Rector A wrote: Reason for RCV:ELFYBOF called to inform Dr B that she have a bad tooth with pain and that her husband had to have emergency surgery so she cannot get to a dentist right away she is asking if Dr B could prescribe an antibiotic for her. She would like a call back please to let her know if she can get this or not. Ph# [phone]

## 2019-10-07 ENCOUNTER — Telehealth: Payer: Self-pay | Admitting: Family Medicine

## 2019-10-07 NOTE — Telephone Encounter (Signed)
Spoke with patient she did not want to schedule AWV at this time she is caring for her husband due to surgery

## 2019-11-05 ENCOUNTER — Ambulatory Visit: Payer: Medicare Other | Admitting: Family Medicine

## 2019-11-09 NOTE — Progress Notes (Signed)
Cardiology Office Note  Date:  11/10/2019   ID:  Jasmine [name], DOB 11-01-1950, MRN [id number]  PCP:  Erasmo Downer, MD   Chief Complaint  Patient presents with   office visit    3 month F/U; Meds verbally reviewed with patient.    HPI:  Jasmine [name] is a 69 year old woman with past medical history of Chronic Hypertensive urgency Right basal ganglia infarct Smoker Who presents for routine follow-up for hypertension, history of stroke, smoker  Last seen in clinic August 10, 2019  hospital March 2021 with hypertensive emergency left-sided weakness dysarthria when getting out of bed Blood pressure 230/108 MRI acute right basal ganglia infarct Neurology felt  consistent with hypertensive encephalopathy  started on cleviprex along with amlodipine and HCTZ.  Overnight 3/14, cleviprex was weaned off   instructed by neurology to take aspirin Plavix for 3 weeks then stop Plavix and continue aspirin indefinitely  Echocardiogram July 18, 2019 Ejection fraction 60 to 65%, moderate LVH Grade 1 diastolic dysfunction  CT scan results reviewed Atherosclerotic plaque  affecting both common carotid arteries without measurable stenosis.  Soft and calcified plaque at both carotid bifurcations and ICA bulbs but without measurable stenosis. No posterior circulation pathology. Emphysema.  Other lab work reviewed sodium dropping from 140 down to 129 in March 2021  drop in potassium, on potassium supplement New lab work pending  No recent lipid panel Plays with Jasmine [name], very active  EKG personally reviewed by myself on todays visit Shows normal sinus rhythm rate 59 bpm no significant ST-T wave changes   PMH:   has a past medical history of Basal ganglia stroke (HCC), Hypertension, Hypertensive emergency, and Left wrist fracture.  PSH:    Past Surgical History:  Procedure Laterality Date   TUBAL LIGATION  1981    Current Outpatient Medications  Medication Sig  Dispense Refill   amLODipine (NORVASC) 10 MG tablet Take 1 tablet (10 mg total) by mouth daily. 90 tablet 1   aspirin 81 MG EC tablet Take 1 tablet (81 mg total) by mouth daily. 90 tablet 1   atorvastatin (LIPITOR) 80 MG tablet Take 1 tablet (80 mg total) by mouth daily at 6 PM. 90 tablet 1   cholecalciferol (VITAMIN D3) 25 MCG (1000 UNIT) tablet Take 1,000 Units by mouth daily.     hydrALAZINE (APRESOLINE) 50 MG tablet Take 1 tablet (50 mg total) by mouth 3 (three) times daily. 270 tablet 1   losartan (COZAAR) 100 MG tablet Take 50 mg by mouth daily.     Probiotic Product (PROBIOTIC-10 ULTIMATE) CAPS Take 1 capsule by mouth daily.     No current facility-administered medications for this visit.     Allergies:   Patient has no known allergies.   Social History:  The patient  reports that she has been smoking cigarettes. She has a 55.00 pack-year smoking history. She has never used smokeless tobacco. She reports previous alcohol use. She reports previous drug use.   Family History:   family history includes Cancer in her father, mother, and sister.    Review of Systems: Review of Systems  Constitutional: Negative.   HENT: Negative.   Respiratory: Negative.   Cardiovascular: Negative.   Gastrointestinal: Negative.   Musculoskeletal: Negative.   Neurological: Negative.   Psychiatric/Behavioral: Negative.   All other systems reviewed and are negative.   PHYSICAL EXAM: VS:  BP 130/60 (BP Location: Left Arm, Patient Position: Sitting, Cuff Size: Normal)    Pulse (!) 59  Ht 5' (1.524 m)    Wt 98 lb (44.5 kg)    SpO2 96%    BMI 19.14 kg/m  , BMI Body mass index is 19.14 kg/m. GEN: Well nourished, well developed, in no acute distress HEENT: normal Neck: no JVD, carotid bruits, or masses Cardiac: RRR; no murmurs, rubs, or gallops,no edema  Respiratory:  clear to auscultation bilaterally, normal work of breathing GI: soft, nontender, nondistended, + BS MS: no deformity or  atrophy Skin: warm and dry, no rash Neuro:  Strength and sensation are intact Psych: euthymic mood, full affect   Recent Labs: 07/17/2019: B Natriuretic Peptide 140.7 07/18/2019: Magnesium 1.9 07/21/2019: Hemoglobin 14.6; Platelets 333 08/05/2019: BUN 7; Creatinine, Ser 0.58; Potassium 3.3; Sodium 129; TSH 4.170    Lipid Panel Lab Results  Component Value Date   CHOL 235 (H) 07/19/2019   HDL 49 07/19/2019   LDLCALC 158 (H) 07/19/2019   TRIG 141 07/19/2019   TRIG 138 07/19/2019      Wt Readings from Last 3 Encounters:  11/10/19 98 lb (44.5 kg)  08/25/19 99 lb (44.9 kg)  08/10/19 97 lb 4 oz (44.1 kg)      ASSESSMENT AND PLAN:  Problem List Items Addressed This Visit      Cardiology Problems   Essential hypertension - Primary   Relevant Medications   losartan (COZAAR) 100 MG tablet   Other Relevant Orders   EKG 12-Lead   Hyperlipidemia   Relevant Medications   losartan (COZAAR) 100 MG tablet     Other   History of CVA (cerebrovascular accident)   Palpitations   Relevant Orders   EKG 12-Lead   Tobacco abuse     Hypertension/stroke Discussed recent hospital course stroke felt secondary to chronic poorly controlled hypertension Moderate LVH on echocardiogram consistent with chronic hypertension HCTZ previously held for hyponatremia Losartan will now, no changes to her regimen  Smoking We have encouraged her to continue to work on weaning her cigarettes and smoking cessation. She will continue to work on this and does not want any assistance with chantix.   Hyperlipidemia On statin   Disposition:   F/U  12 months   Total encounter time more than 25 minutes  Greater than 50% was spent in counseling and coordination of care with the patient    Signed, Dossie Arbour, M.D., Ph.D. [facility] [place], [place] [phone]

## 2019-11-10 ENCOUNTER — Encounter: Payer: Self-pay | Admitting: Cardiovascular Disease

## 2019-11-10 ENCOUNTER — Ambulatory Visit: Payer: Medicare Other | Admitting: Cardiovascular Disease

## 2019-11-10 ENCOUNTER — Other Ambulatory Visit: Payer: Self-pay

## 2019-11-10 VITALS — BP 130/60 | HR 59 | Ht 60.0 in | Wt 98.0 lb

## 2019-11-10 DIAGNOSIS — R002 Palpitations: Secondary | ICD-10-CM | POA: Diagnosis not present

## 2019-11-10 DIAGNOSIS — Z720 Tobacco use: Secondary | ICD-10-CM | POA: Diagnosis not present

## 2019-11-10 DIAGNOSIS — I10 Essential (primary) hypertension: Secondary | ICD-10-CM | POA: Diagnosis not present

## 2019-11-10 DIAGNOSIS — Z8673 Personal history of transient ischemic attack (TIA), and cerebral infarction without residual deficits: Secondary | ICD-10-CM | POA: Diagnosis not present

## 2019-11-10 DIAGNOSIS — E782 Mixed hyperlipidemia: Secondary | ICD-10-CM | POA: Diagnosis not present

## 2019-11-10 NOTE — Patient Instructions (Signed)

## 2019-11-10 NOTE — Progress Notes (Signed)
Established patient visit   Patient: Jasmine [name]   DOB: 1951/03/25   69 y.o. Female  MRN: [id number] Visit Date: 11/12/2019  Today's healthcare provider: Dortha Kern, PA   Chief Complaint  Patient presents with  . Hyperlipidemia  . Hypertension   [employer] as a Neurosurgeon for [employer], PA.,have documented all relevant documentation on the behalf of [employer], PA,as directed by  [employer], PA while in the presence of [employer], [place].  Subjective    HPI  Hypertension, follow-up  BP Readings from Last 3 Encounters:  11/12/19 125/68  11/10/19 130/60  08/25/19 134/69   Wt Readings from Last 3 Encounters:  11/12/19 98 lb (44.5 kg)  11/10/19 98 lb (44.5 kg)  08/25/19 99 lb (44.9 kg)     She was last seen for hypertension 3 months ago.  BP at that visit was 133/57. Management since that visit includes decrease Hydralazine to TID. She reports good compliance with treatment. She is not having side effects.  She is exercising. She is adherent to low salt diet.   Outside blood pressures are being checked at home.  She does smoke.  Use of agents associated with hypertension: none.   --------------------------------------------------------------------------------------------------- Lipid/Cholesterol, follow-up  Last Lipid Panel: Lab Results  Component Value Date   CHOL 235 (H) 07/19/2019   LDLCALC 158 (H) 07/19/2019   HDL 49 07/19/2019   TRIG 141 07/19/2019   TRIG 138 07/19/2019    She was last seen for this 3 months ago.  Management since that visit includes no change.  She reports good compliance with treatment. She is not having side effects.   Symptoms: No appetite changes No foot ulcerations  No chest pain No chest pressure/discomfort  No dyspnea No orthopnea  No fatigue No lower extremity edema  No palpitations No paroxysmal nocturnal dyspnea  No nausea No numbness or tingling of extremity  No polydipsia  No polyuria  No speech difficulty No syncope   She is following a Regular diet. Current exercise: walking  Last metabolic panel Lab Results  Component Value Date   GLUCOSE 85 08/05/2019   NA 129 (L) 08/05/2019   K 3.3 (L) 08/05/2019   BUN 7 (L) 08/05/2019   CREATININE 0.58 08/05/2019   GFRNONAA 96 08/05/2019   GFRAA 110 08/05/2019   CALCIUM 10.2 08/05/2019   The ASCVD Risk score (Goff DC Jr., et al., 2013) failed to calculate for the following reasons:   The patient has a prior MI or stroke diagnosis  ---------------------------------------------------------------------------------------------------   Past Medical History:  Diagnosis Date  . Basal ganglia stroke (HCC)   . Hypertension   . Hypertensive emergency   . Left wrist fracture    Past Surgical History:  Procedure Laterality Date  . TUBAL LIGATION  1981   Social History   Tobacco Use  . Smoking status: Current Every Day Smoker    Packs/day: 1.00    Years: 55.00    Pack years: 55.00    Types: Cigarettes  . Smokeless tobacco: Never Used  Vaping Use  . Vaping Use: Never used  Substance Use Topics  . Alcohol use: Not Currently  . Drug use: Not Currently   Family Status  Relation Name Status  . Mother  Deceased  . Father  Deceased  . Sister  Deceased  . Brother  Deceased  . Daughter  Alive  . Brother  Deceased  . Daughter  Alive   No Known Allergies  Medications: Outpatient Medications Prior to Visit  Medication Sig  . amLODipine (NORVASC) 10 MG tablet Take 1 tablet (10 mg total) by mouth daily.  Marland Kitchen aspirin 81 MG EC tablet Take 1 tablet (81 mg total) by mouth daily.  Marland Kitchen atorvastatin (LIPITOR) 80 MG tablet Take 1 tablet (80 mg total) by mouth daily at 6 PM.  . cholecalciferol (VITAMIN D3) 25 MCG (1000 UNIT) tablet Take 1,000 Units by mouth daily.  . hydrALAZINE (APRESOLINE) 50 MG tablet Take 1 tablet (50 mg total) by mouth 3 (three) times daily.  Marland Kitchen losartan (COZAAR) 100 MG tablet Take 50 mg by  mouth daily.  . Probiotic Product (PROBIOTIC-10 ULTIMATE) CAPS Take 1 capsule by mouth daily.   No facility-administered medications prior to visit.    Review of Systems  Constitutional: Negative.   Respiratory: Negative.   Cardiovascular: Negative.   Musculoskeletal: Negative.       Objective    BP 125/68 (BP Location: Right Arm, Patient Position: Sitting, Cuff Size: Normal)   Pulse 60   Temp (!) 97.1 F (36.2 C) (Temporal)   Wt 98 lb (44.5 kg)   BMI 19.14 kg/m  BP Readings from Last 3 Encounters:  11/12/19 125/68  11/10/19 130/60  08/25/19 134/69   Wt Readings from Last 3 Encounters:  11/12/19 98 lb (44.5 kg)  11/10/19 98 lb (44.5 kg)  08/25/19 99 lb (44.9 kg)     Physical Exam   General: Appearance:    Thin female in no acute distress  Eyes:    PERRL, conjunctiva/corneas clear, EOM's intact       Lungs:     Clear to auscultation bilaterally, respirations unlabored  Heart:    Normal heart rate. Normal rhythm. No murmurs, rubs, or gallops.   MS:   All extremities are intact.   Neurologic:   Awake, alert, oriented x 3. No apparent focal neurological           defect.       No results found for any visits on 11/12/19.  Assessment & Plan     1. Essential hypertension Well controlled BP. Weight is stable and followed by Dr. Mariah Milling (cardiologist) on 11-10-19. Continues Amlodipine 10 mg qd, Hydralazine 50 mg TID and Losartan 100 mg qd without side effects. Still working on tobacco use and down to 1/2 ppd now. Recheck CBC, CMP and Lipid Panel. Follow up with Dr. Beryle Flock (PCP) in 3-4 months. - CBC with Differential/Platelet - Comprehensive metabolic panel - Lipid panel  2. Mixed hyperlipidemia Tolerating the Atorvastatin 80 mg qd and low fat diet. Recheck labs. - Comprehensive metabolic panel - Lipid panel  3. Tobacco abuse Was smoking up to 2 ppd prior to CVA. Now down to 1/2 ppd. Continue efforts to totally quit smoking. Recheck in 3-4 months.  4. History of  CVA (cerebrovascular accident) Had a stroke in March 2021 that caused weakness in the left arm and leg. All symptoms have resolved and full strength has returned.  5. History of hypokalemia Had K+ of 3.3 in March 2021. No longer taking any potassium supplements. Recheck CMP. - Comprehensive metabolic panel   No follow-ups on file.      Haywood Pao, PA, have reviewed all documentation for this visit. The documentation on 11/12/19 for the exam, diagnosis, procedures, and orders are all accurate and complete.    Dortha Kern, PA  [facility] [phone] (phone) [phone] (fax)  [facility]

## 2019-11-12 ENCOUNTER — Ambulatory Visit (INDEPENDENT_AMBULATORY_CARE_PROVIDER_SITE_OTHER): Payer: Medicare Other | Admitting: Family Medicine

## 2019-11-12 ENCOUNTER — Other Ambulatory Visit: Payer: Self-pay

## 2019-11-12 ENCOUNTER — Encounter: Payer: Self-pay | Admitting: Family Medicine

## 2019-11-12 VITALS — BP 125/68 | HR 60 | Temp 97.1°F | Wt 98.0 lb

## 2019-11-12 DIAGNOSIS — Z720 Tobacco use: Secondary | ICD-10-CM

## 2019-11-12 DIAGNOSIS — E782 Mixed hyperlipidemia: Secondary | ICD-10-CM | POA: Diagnosis not present

## 2019-11-12 DIAGNOSIS — Z8673 Personal history of transient ischemic attack (TIA), and cerebral infarction without residual deficits: Secondary | ICD-10-CM | POA: Diagnosis not present

## 2019-11-12 DIAGNOSIS — I10 Essential (primary) hypertension: Secondary | ICD-10-CM

## 2019-11-12 DIAGNOSIS — Z8639 Personal history of other endocrine, nutritional and metabolic disease: Secondary | ICD-10-CM

## 2019-11-13 LAB — CBC WITH DIFFERENTIAL/PLATELET
Basophils Absolute: 0.1 10*3/uL (ref 0.0–0.2)
Basos: 1 %
EOS (ABSOLUTE): 0.2 10*3/uL (ref 0.0–0.4)
Eos: 2 %
Hematocrit: 38 % (ref 34.0–46.6)
Hemoglobin: 12.5 g/dL (ref 11.1–15.9)
Immature Grans (Abs): 0 10*3/uL (ref 0.0–0.1)
Immature Granulocytes: 0 %
Lymphocytes Absolute: 2.6 10*3/uL (ref 0.7–3.1)
Lymphs: 30 %
MCH: 29.3 pg (ref 26.6–33.0)
MCHC: 32.9 g/dL (ref 31.5–35.7)
MCV: 89 fL (ref 79–97)
Monocytes Absolute: 0.8 10*3/uL (ref 0.1–0.9)
Monocytes: 9 %
Neutrophils Absolute: 5.1 10*3/uL (ref 1.4–7.0)
Neutrophils: 58 %
Platelets: 291 10*3/uL (ref 150–450)
RBC: 4.26 x10E6/uL (ref 3.77–5.28)
RDW: 12.5 % (ref 11.7–15.4)
WBC: 8.7 10*3/uL (ref 3.4–10.8)

## 2019-11-13 LAB — COMPREHENSIVE METABOLIC PANEL
ALT: 22 IU/L (ref 0–32)
AST: 22 IU/L (ref 0–40)
Albumin/Globulin Ratio: 2.1 (ref 1.2–2.2)
Albumin: 4.4 g/dL (ref 3.8–4.8)
Alkaline Phosphatase: 122 IU/L — ABNORMAL HIGH (ref 48–121)
BUN/Creatinine Ratio: 10 — ABNORMAL LOW (ref 12–28)
BUN: 6 mg/dL — ABNORMAL LOW (ref 8–27)
Bilirubin Total: 0.5 mg/dL (ref 0.0–1.2)
CO2: 21 mmol/L (ref 20–29)
Calcium: 9.7 mg/dL (ref 8.7–10.3)
Chloride: 102 mmol/L (ref 96–106)
Creatinine, Ser: 0.6 mg/dL (ref 0.57–1.00)
GFR calc Af Amer: 109 mL/min/{1.73_m2} (ref >59)
GFR calc non Af Amer: 95 mL/min/{1.73_m2} (ref >59)
Globulin, Total: 2.1 g/dL (ref 1.5–4.5)
Glucose: 91 mg/dL (ref 65–99)
Potassium: 4 mmol/L (ref 3.5–5.2)
Sodium: 136 mmol/L (ref 134–144)
Total Protein: 6.5 g/dL (ref 6.0–8.5)

## 2019-11-13 LAB — LIPID PANEL
Chol/HDL Ratio: 2.3 ratio (ref 0.0–4.4)
Cholesterol, Total: 100 mg/dL (ref 100–199)
HDL: 43 mg/dL (ref >39)
LDL Chol Calc (NIH): 41 mg/dL (ref 0–99)
Triglycerides: 75 mg/dL (ref 0–149)
VLDL Cholesterol Cal: 16 mg/dL (ref 5–40)

## 2019-11-23 ENCOUNTER — Telehealth: Payer: Self-pay | Admitting: Cardiovascular Disease

## 2019-11-23 NOTE — Telephone Encounter (Signed)
Patient daughter calling in to state she is supposed to have jury duty this week. Patient is calling in as the primary caregiver for both of her parents to see if we can write her a letter to state that and that she may not have to do jury duty  Please advise

## 2019-11-23 NOTE — Telephone Encounter (Signed)
Spoke with patients daughter and she got letter from Careers adviser and has sent that to the court. No further need at this time.

## 2019-12-09 ENCOUNTER — Ambulatory Visit: Payer: Medicare Other | Admitting: Adult Health

## 2020-02-02 ENCOUNTER — Other Ambulatory Visit: Payer: Self-pay | Admitting: Family Medicine

## 2020-02-02 MED ORDER — AMLODIPINE BESYLATE 10 MG PO TABS: 10.0000 mg | ORAL_TABLET | Freq: Every day | ORAL | 1 refills | Status: DC

## 2020-02-02 NOTE — Telephone Encounter (Signed)
[facility] faxed refill request for the following medications:  2nd request: amLODipine (NORVASC) 10 MG tablet   Please advise. Thanks, [employer]

## 2020-03-07 ENCOUNTER — Other Ambulatory Visit: Payer: Self-pay | Admitting: Family Medicine

## 2020-03-07 MED ORDER — ATORVASTATIN CALCIUM 80 MG PO TABS: 80.0000 mg | ORAL_TABLET | Freq: Every day | ORAL | 1 refills | Status: DC

## 2020-03-07 NOTE — Telephone Encounter (Signed)
Requested Prescriptions  Pending Prescriptions Disp Refills  . atorvastatin (LIPITOR) 80 MG tablet 90 tablet 1    Sig: Take 1 tablet (80 mg total) by mouth daily at 6 PM.     Cardiovascular:  Antilipid - Statins Failed - 03/07/2020  3:39 PM      Failed - LDL in normal range and within 360 days    LDL Chol Calc (NIH)  Date Value Ref Range Status  11/12/2019 41 0 - 99 mg/dL Final         Passed - Total Cholesterol in normal range and within 360 days    Cholesterol, Total  Date Value Ref Range Status  11/12/2019 100 100 - 199 mg/dL Final         Passed - HDL in normal range and within 360 days    HDL  Date Value Ref Range Status  11/12/2019 43 >39 mg/dL Final         Passed - Triglycerides in normal range and within 360 days    Triglycerides  Date Value Ref Range Status  11/12/2019 75 0 - 149 mg/dL Final         Passed - Patient is not pregnant      Passed - Valid encounter within last 12 months    Recent Outpatient Visits          3 months ago Essential hypertension   [facility], Jodell Cipro, PA   5 months ago Dental abscess   [facility] [place], Marzella Schlein, MD   7 months ago Essential hypertension   [facility] [place], Marzella Schlein, MD

## 2020-03-07 NOTE — Addendum Note (Signed)
Addended by: Cleotilde Neer on: 03/07/2020 03:39 PM   Modules accepted: Orders

## 2020-03-07 NOTE — Telephone Encounter (Signed)
PT need a refill  atorvastatin (LIPITOR) 80 MG tablet [[id number]] [id number] [place], [place] - [address] AT [facility] OF [address]  [address] [place] [address]  Phone: [phone] Fax: [phone]

## 2020-03-15 ENCOUNTER — Other Ambulatory Visit: Payer: Self-pay | Admitting: Family Medicine

## 2020-03-15 MED ORDER — HYDRALAZINE HCL 50 MG PO TABS: 50.0000 mg | ORAL_TABLET | Freq: Three times a day (TID) | ORAL | 0 refills | Status: DC

## 2020-03-15 NOTE — Telephone Encounter (Signed)
Copied from CRM [id number]. Topic: Quick Communication - Rx Refill/Question >> Mar 15, 2020  1:30 PM Mcneil, Ja-Kwan wrote: Medication: hydrALAZINE (APRESOLINE) 50 MG tablet  Has the patient contacted their pharmacy? yes   Preferred Pharmacy (with phone number or street name): [facility] #[id number] [place], [place] - [address] AT [facility] OF [address] Phone: [phone]  Fax: [phone]  Agent: Please be advised that RX refills may take up to 3 business days. We ask that you follow-up with your pharmacy.

## 2020-06-12 ENCOUNTER — Other Ambulatory Visit: Payer: Self-pay | Admitting: Family Medicine

## 2020-06-30 NOTE — Progress Notes (Signed)
Subjective:   Jasmine [name] is a 70 y.o. female who presents for an Initial Medicare Annual Wellness Visit.  Review of Systems    N/A  Cardiac Risk Factors include: advanced age (>4men, >67 women);dyslipidemia;hypertension     Objective:    Today's Vitals   07/04/20 1527  BP: (!) 108/50  Pulse: (!) 56  Temp: (!) 97.5 F (36.4 C)  TempSrc: Oral  SpO2: 96%  Weight: 93 lb 6.4 oz (42.4 kg)  Height: 5' (1.524 m)  PainSc: 0-No pain   Body mass index is 18.24 kg/m.  Advanced Directives 07/04/2020 08/03/2019 07/17/2019 07/04/2018  Does Patient Have a Medical Advance Directive? Yes No No No  Type of Estate agent of [place];Living will - - -  Copy of Healthcare Power of Attorney in Chart? No - copy requested - - -  Would patient like information on creating a medical advance directive? - No - Patient declined No - Patient declined No - Patient declined    Current Medications (verified) Outpatient Encounter Medications as of 07/04/2020  Medication Sig  . amLODipine (NORVASC) 10 MG tablet Take 1 tablet (10 mg total) by mouth daily.  Marland Kitchen aspirin 81 MG EC tablet Take 1 tablet (81 mg total) by mouth daily.  Marland Kitchen atorvastatin (LIPITOR) 80 MG tablet Take 1 tablet (80 mg total) by mouth daily at 6 PM.  . hydrALAZINE (APRESOLINE) 50 MG tablet TAKE 1 TABLET(50 MG) BY MOUTH THREE TIMES DAILY  . losartan (COZAAR) 100 MG tablet Take 50 mg by mouth daily.  . Multiple Vitamins-Minerals (EMERGEN-C IMMUNE PLUS/VIT D PO) Take 1 tablet by mouth daily at 6 (six) AM. With zinc  . Probiotic Product (PROBIOTIC-10 ULTIMATE) CAPS Take 1 capsule by mouth daily.  . cholecalciferol (VITAMIN D3) 25 MCG (1000 UNIT) tablet Take 1,000 Units by mouth daily. (Patient not taking: Reported on 07/04/2020)   No facility-administered encounter medications on file as of 07/04/2020.    Allergies (verified) Patient has no known allergies.   History: Past Medical History:  Diagnosis Date  .  Basal ganglia stroke (HCC)   . Hyperlipidemia   . Hypertension   . Hypertensive emergency   . Left wrist fracture    Past Surgical History:  Procedure Laterality Date  . TUBAL LIGATION  1981   Family History  Problem Relation Age of Onset  . Cancer Mother   . Cancer Father   . Cancer Sister        throat cancer   Social History   Socioeconomic History  . Marital status: Married    Spouse name: Not on file  . Number of children: 2  . Years of education: Not on file  . Highest education level: GED or equivalent  Occupational History  . Occupation: homemaker  Tobacco Use  . Smoking status: Current Every Day Smoker    Packs/day: 0.75    Years: 55.00    Pack years: 41.25    Types: Cigarettes  . Smokeless tobacco: Never Used  Vaping Use  . Vaping Use: Never used  Substance and Sexual Activity  . Alcohol use: Not Currently  . Drug use: Not Currently  . Sexual activity: Not Currently  Other Topics Concern  . Not on file  Social History Narrative  . Not on file   Social Determinants of Health   Financial Resource Strain: Low Risk   . Difficulty of Paying Living Expenses: Not hard at all  Food Insecurity: No Food Insecurity  . Worried About  Running Out of Food in the Last Year: Never true  . Ran Out of Food in the Last Year: Never true  Transportation Needs: No Transportation Needs  . Lack of Transportation (Medical): No  . Lack of Transportation (Non-Medical): No  Physical Activity: Inactive  . Days of Exercise per Week: 0 days  . Minutes of Exercise per Session: 0 min  Stress: No Stress Concern Present  . Feeling of Stress : Not at all  Social Connections: Moderately Isolated  . Frequency of Communication with Friends and Family: Three times a week  . Frequency of Social Gatherings with Friends and Family: More than three times a week  . Attends Religious Services: Never  . Active Member of Clubs or Organizations: No  . Attends Banker Meetings:  Never  . Marital Status: Married    Tobacco Counseling Ready to quit: Yes Counseling given: No   Clinical Intake:  Pre-visit preparation completed: Yes  Pain : No/denies pain Pain Score: 0-No pain     Nutritional Status: BMI <19  Underweight Nutritional Risks: None Diabetes: No  How often do you need to have someone help you when you read instructions, pamphlets, or other written materials from your doctor or pharmacy?: 1 - Never  Diabetic? No  Interpreter Needed?: No  Information entered by :: [facility], LPN   Activities of Daily Living In your present state of health, do you have any difficulty performing the following activities: 07/04/2020 08/05/2019  Hearing? N N  Vision? N N  Difficulty concentrating or making decisions? N Y  Walking or climbing stairs? N N  Dressing or bathing? N N  Doing errands, shopping? N N  Preparing Food and eating ? N -  Using the Toilet? N -  In the past six months, have you accidently leaked urine? N -  Do you have problems with loss of bowel control? N -  Managing your Medications? N -  Managing your Finances? N -  Housekeeping or managing your Housekeeping? N -  Some recent data might be hidden    Patient Care Team: Erasmo Downer, MD as PCP - General (Family Medicine) Antonieta Iba, MD as Consulting Physician (Cardiology) Pa, [facility] (Optometry)  Indicate any recent Medical Services you may have received from other than [facility] providers in the past year (date may be approximate).     Assessment:   This is a routine wellness examination for Jasmine [name].  Hearing/Vision screen No exam data present  Dietary issues and exercise activities discussed: Current Exercise Habits: The patient does not participate in regular exercise at present, Exercise limited by: None identified  Goals    . Quit Smoking      Depression Screen PHQ 2/9 Scores 07/04/2020 08/05/2019  PHQ - 2 Score 0 2  PHQ- 9 Score - 11    Fall  Risk Fall Risk  07/04/2020 08/05/2019  Falls in the past year? 0 0  Number falls in past yr: 0 0  Injury with Fall? 0 0  Follow up - Falls evaluation completed    FALL RISK PREVENTION PERTAINING TO THE HOME:  Any stairs in or around the home? Yes  If so, are there any without handrails? No  Home free of loose throw rugs in walkways, pet beds, electrical cords, etc? Yes  Adequate lighting in your home to reduce risk of falls? Yes   ASSISTIVE DEVICES UTILIZED TO PREVENT FALLS:  Life alert? No  Use of a cane, walker or w/c?  No  Grab bars in the bathroom? No  Shower chair or bench in shower? No  Elevated toilet seat or a handicapped toilet? No   TIMED UP AND GO:  Was the test performed? Yes .  Length of time to ambulate 10 feet: 9 sec.   Gait steady and fast without use of assistive device  Cognitive Function: Declined today.         Immunizations Immunization History  Administered Date(s) Administered  . Influenza, High Dose Seasonal PF 01/06/2018  . Td 02/02/2011  . Tdap 02/02/2011    TDAP status: Up to date  Flu Vaccine status: Declined, Education has been provided regarding the importance of this vaccine but patient still declined. Advised may receive this vaccine at local pharmacy or Health Dept. Aware to provide a copy of the vaccination record if obtained from local pharmacy or Health Dept. Verbalized acceptance and understanding.  Pneumococcal vaccine status: Declined,  Education has been provided regarding the importance of this vaccine but patient still declined. Advised may receive this vaccine at local pharmacy or Health Dept. Aware to provide a copy of the vaccination record if obtained from local pharmacy or Health Dept. Verbalized acceptance and understanding.   Covid-19 vaccine status: Declined, Education has been provided regarding the importance of this vaccine but patient still declined. Advised may receive this vaccine at local pharmacy or Health Dept.or  vaccine clinic. Aware to provide a copy of the vaccination record if obtained from local pharmacy or Health Dept. Verbalized acceptance and understanding.  Qualifies for Shingles Vaccine? Yes   Zostavax completed No   Shingrix Completed?: No.    Education has been provided regarding the importance of this vaccine. Patient has been advised to call insurance company to determine out of pocket expense if they have not yet received this vaccine. Advised may also receive vaccine at local pharmacy or Health Dept. Verbalized acceptance and understanding.  Screening Tests Health Maintenance  Topic Date Due  . Hepatitis C Screening  Never done  . COVID-19 Vaccine (1) 07/20/2020 (Originally 05/01/1956)  . INFLUENZA VACCINE  08/04/2020 (Originally 12/06/2019)  . MAMMOGRAM  07/04/2021 (Originally 05/01/2002)  . DEXA SCAN  07/04/2021 (Originally 05/01/2017)  . COLONOSCOPY (Pts 45-74yrs Insurance coverage will need to be confirmed)  07/04/2021 (Originally 05/01/1997)  . PNA vac Low Risk Adult (1 of 2 - PCV13) 07/04/2021 (Originally 05/01/2017)  . TETANUS/TDAP  02/01/2021    Health Maintenance  Health Maintenance Due  Topic Date Due  . Hepatitis C Screening  Never done    Colorectal cancer screening: Currently due, declined a colonoscopy referral or cologuard order at this time.   Mammogram status: Currently due, declined order at this time.   Bone Density status: Currently due, declined order at this time.   Lung Cancer Screening: (Low Dose CT Chest recommended if Age 52-80 years, 30 pack-year currently smoking OR have quit w/in 15years.) does qualify however declines order.  Additional Screening:  Hepatitis C Screening: does qualify and would like this added to her next in office blood work orders.   Vision Screening: Recommended annual ophthalmology exams for early detection of glaucoma and other disorders of the eye. Is the patient up to date with their annual eye exam?  Yes  Who is the  provider or what is the name of the office in which the patient attends annual eye exams? [facility] If pt is not established with a provider, would they like to be referred to a provider to establish care?  No .   Dental Screening: Recommended annual dental exams for proper oral hygiene  Community Resource Referral / Chronic Care Management: CRR required this visit?  No   CCM required this visit?  No      Plan:     I have personally reviewed and noted the following in the patient's chart:   . Medical and social history . Use of alcohol, tobacco or illicit drugs  . Current medications and supplements . Functional ability and status . Nutritional status . Physical activity . Advanced directives . List of other physicians . Hospitalizations, surgeries, and ER visits in previous 12 months . Vitals . Screenings to include cognitive, depression, and falls . Referrals and appointments  In addition, I have reviewed and discussed with patient certain preventive protocols, quality metrics, and best practice recommendations. A written personalized care plan for preventive services as well as general preventive health recommendations were provided to patient.     Lurdes Haltiwanger Rohnert Park[place], [place]   [id number]/09602/28/2022   Nurse Notes: Pt would like the Hep C lab order added to next in office blood work orders. Pt declined: the Covid vaccine, flu vaccine, mammogram order, colonoscopy referral, cologuard order, DEXA order and pneumonia vaccine.

## 2020-07-04 ENCOUNTER — Other Ambulatory Visit: Payer: Self-pay

## 2020-07-04 ENCOUNTER — Ambulatory Visit (INDEPENDENT_AMBULATORY_CARE_PROVIDER_SITE_OTHER): Payer: Medicare Other

## 2020-07-04 VITALS — BP 108/50 | HR 56 | Temp 97.5°F | Ht 60.0 in | Wt 93.4 lb

## 2020-07-04 DIAGNOSIS — Z0000 Encounter for general adult medical examination without abnormal findings: Secondary | ICD-10-CM | POA: Diagnosis not present

## 2020-07-04 NOTE — Patient Instructions (Signed)
Jasmine [name] , Thank you for taking time to come for your Medicare Wellness Visit. I appreciate your ongoing commitment to your health goals. Please review the following plan we discussed and let me know if I can assist you in the future.   Screening recommendations/referrals: Colonoscopy: Currently due, declined referral or cologuard order. Mammogram: Currently due, declined order.  Bone Density: Currently due, declined order.  Recommended yearly ophthalmology/optometry visit for glaucoma screening and checkup Recommended yearly dental visit for hygiene and checkup  Vaccinations: Influenza vaccine: Currently due, declined receiving.  Pneumococcal vaccine: Currently due, declined receiving. Tdap vaccine: Up to date, due 01/2021 Shingles vaccine: Shingrix discussed. Please contact your pharmacy for coverage information.     Advanced directives: Please bring a copy of your POA (Power of Attorney) and/or Living Will to your next appointment.   Conditions/risks identified: Smoking cessation discussed today.   Next appointment: None, declined scheduling a follow up with PCP or an AWV for 2023 at this time.    Preventive Care 35 Years and Older, Female Preventive care refers to lifestyle choices and visits with your health care provider that can promote health and wellness. What does preventive care include?  A yearly physical exam. This is also called an annual well check.  Dental exams once or twice a year.  Routine eye exams. Ask your health care provider how often you should have your eyes checked.  Personal lifestyle choices, including:  Daily care of your teeth and gums.  Regular physical activity.  Eating a healthy diet.  Avoiding tobacco and drug use.  Limiting alcohol use.  Practicing safe sex.  Taking low-dose aspirin every day.  Taking vitamin and mineral supplements as recommended by your health care provider. What happens during an annual well check? The  services and screenings done by your health care provider during your annual well check will depend on your age, overall health, lifestyle risk factors, and family history of disease. Counseling  Your health care provider may ask you questions about your:  Alcohol use.  Tobacco use.  Drug use.  Emotional well-being.  Home and relationship well-being.  Sexual activity.  Eating habits.  History of falls.  Memory and ability to understand (cognition).  Work and work Astronomer.  Reproductive health. Screening  You may have the following tests or measurements:  Height, weight, and BMI.  Blood pressure.  Lipid and cholesterol levels. These may be checked every 5 years, or more frequently if you are over 72 years old.  Skin check.  Lung cancer screening. You may have this screening every year starting at age 20 if you have a 30-pack-year history of smoking and currently smoke or have quit within the past 15 years.  Fecal occult blood test (FOBT) of the stool. You may have this test every year starting at age 40.  Flexible sigmoidoscopy or colonoscopy. You may have a sigmoidoscopy every 5 years or a colonoscopy every 10 years starting at age 37.  Hepatitis C blood test.  Hepatitis B blood test.  Sexually transmitted disease (STD) testing.  Diabetes screening. This is done by checking your blood sugar (glucose) after you have not eaten for a while (fasting). You may have this done every 1-3 years.  Bone density scan. This is done to screen for osteoporosis. You may have this done starting at age 76.  Mammogram. This may be done every 1-2 years. Talk to your health care provider about how often you should have regular mammograms. Talk with your health  care provider about your test results, treatment options, and if necessary, the need for more tests. Vaccines  Your health care provider may recommend certain vaccines, such as:  Influenza vaccine. This is recommended  every year.  Tetanus, diphtheria, and acellular pertussis (Tdap, Td) vaccine. You may need a Td booster every 10 years.  Zoster vaccine. You may need this after age 1.  Pneumococcal 13-valent conjugate (PCV13) vaccine. One dose is recommended after age 48.  Pneumococcal polysaccharide (PPSV23) vaccine. One dose is recommended after age 21. Talk to your health care provider about which screenings and vaccines you need and how often you need them. This information is not intended to replace advice given to you by your health care provider. Make sure you discuss any questions you have with your health care provider. Document Released: 05/20/2015 Document Revised: 01/11/2016 Document Reviewed: 02/22/2015 Elsevier Interactive Patient Education  2017 [place] Prevention in the Home Falls can cause injuries. They can happen to people of all ages. There are many things you can do to make your home safe and to help prevent falls. What can I do on the outside of my home?  Regularly fix the edges of walkways and driveways and fix any cracks.  Remove anything that might make you trip as you walk through a door, such as a raised step or threshold.  Trim any bushes or trees on the path to your home.  Use bright outdoor lighting.  Clear any walking paths of anything that might make someone trip, such as rocks or tools.  Regularly check to see if handrails are loose or broken. Make sure that both sides of any steps have handrails.  Any raised decks and porches should have guardrails on the edges.  Have any leaves, snow, or ice cleared regularly.  Use sand or salt on walking paths during winter.  Clean up any spills in your garage right away. This includes oil or grease spills. What can I do in the bathroom?  Use night lights.  Install grab bars by the toilet and in the tub and shower. Do not use towel bars as grab bars.  Use non-skid mats or decals in the tub or shower.  If  you need to sit down in the shower, use a plastic, non-slip stool.  Keep the floor dry. Clean up any water that spills on the floor as soon as it happens.  Remove soap buildup in the tub or shower regularly.  Attach bath mats securely with double-sided non-slip rug tape.  Do not have throw rugs and other things on the floor that can make you trip. What can I do in the bedroom?  Use night lights.  Make sure that you have a light by your bed that is easy to reach.  Do not use any sheets or blankets that are too big for your bed. They should not hang down onto the floor.  Have a firm chair that has side arms. You can use this for support while you get dressed.  Do not have throw rugs and other things on the floor that can make you trip. What can I do in the kitchen?  Clean up any spills right away.  Avoid walking on wet floors.  Keep items that you use a lot in easy-to-reach places.  If you need to reach something above you, use a strong step stool that has a grab bar.  Keep electrical cords out of the way.  Do not use floor  polish or wax that makes floors slippery. If you must use wax, use non-skid floor wax.  Do not have throw rugs and other things on the floor that can make you trip. What can I do with my stairs?  Do not leave any items on the stairs.  Make sure that there are handrails on both sides of the stairs and use them. Fix handrails that are broken or loose. Make sure that handrails are as long as the stairways.  Check any carpeting to make sure that it is firmly attached to the stairs. Fix any carpet that is loose or worn.  Avoid having throw rugs at the top or bottom of the stairs. If you do have throw rugs, attach them to the floor with carpet tape.  Make sure that you have a light switch at the top of the stairs and the bottom of the stairs. If you do not have them, ask someone to add them for you. What else can I do to help prevent falls?  Wear shoes  that:  Do not have high heels.  Have rubber bottoms.  Are comfortable and fit you well.  Are closed at the toe. Do not wear sandals.  If you use a stepladder:  Make sure that it is fully opened. Do not climb a closed stepladder.  Make sure that both sides of the stepladder are locked into place.  Ask someone to hold it for you, if possible.  Clearly mark and make sure that you can see:  Any grab bars or handrails.  First and last steps.  Where the edge of each step is.  Use tools that help you move around (mobility aids) if they are needed. These include:  Canes.  Walkers.  Scooters.  Crutches.  Turn on the lights when you go into a dark area. Replace any light bulbs as soon as they burn out.  Set up your furniture so you have a clear path. Avoid moving your furniture around.  If any of your floors are uneven, fix them.  If there are any pets around you, be aware of where they are.  Review your medicines with your doctor. Some medicines can make you feel dizzy. This can increase your chance of falling. Ask your doctor what other things that you can do to help prevent falls. This information is not intended to replace advice given to you by your health care provider. Make sure you discuss any questions you have with your health care provider. Document Released: 02/17/2009 Document Revised: 09/29/2015 Document Reviewed: 05/28/2014 Elsevier Interactive Patient Education  2017 [employer].

## 2020-07-07 IMAGING — MR MR HEAD W/O CM
7 of 11 series · 25 of 48 positions shown · non-contrast
Comparison: None.

CLINICAL DATA: Difficulty in moving left arm and leg with fall.

EXAM:
MRI HEAD WITHOUT CONTRAST
TECHNIQUE: Multiplanar, multiecho pulse sequences of the brain and surrounding
structures were obtained without intravenous contrast.

[Series 3: DWI · axial · 3.0mm · 0.94mm/px · z∈[-139,+5]mm · 7 of 98 slices shown (1 of 2)]
[im 1/98]
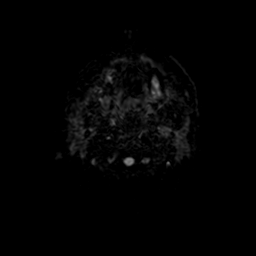
[im 17/98]
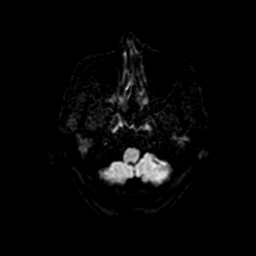
[im 33/98]
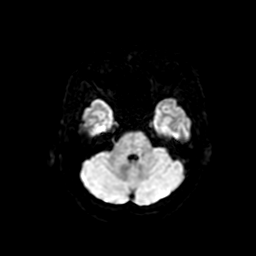
[im 49/98]
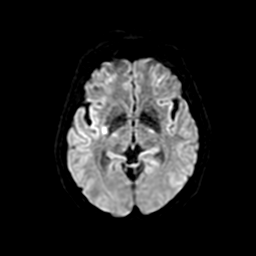
[im 65/98]
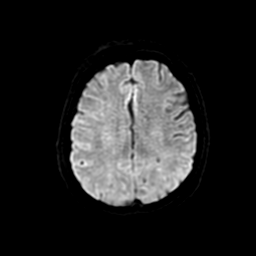
[im 81/98]
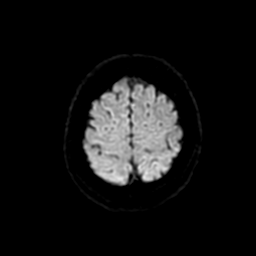
[im 98/98]
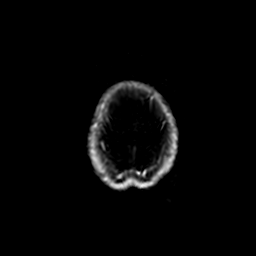

[Series 4: DWI · coronal · 4.0mm · 0.94mm/px · 6 of 70 slices shown (2 of 2)]
[im 1/70]
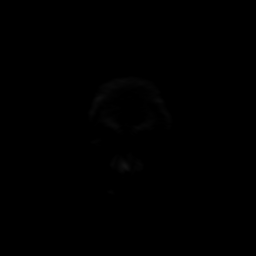
[im 14/70]
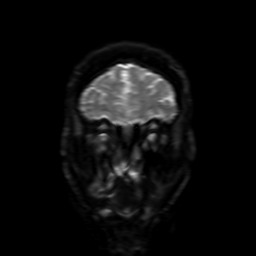
[im 28/70]
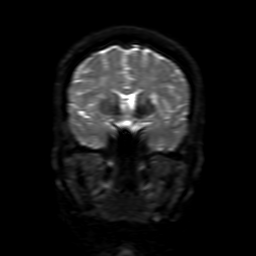
[im 42/70]
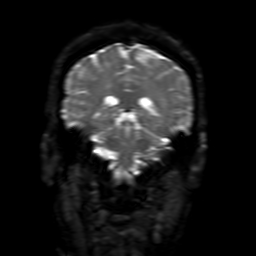
[im 56/70]
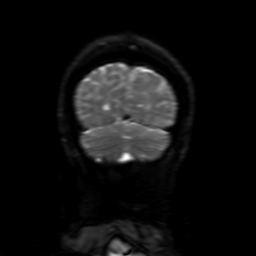
[im 70/70]
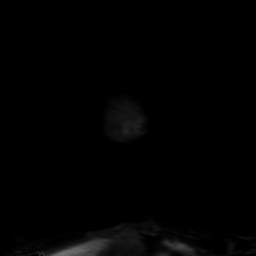

[Series 5: FLAIR · sagittal · 5.0mm · 0.23mm/px · 2 of 23 slices shown (1 of 2)]
[im 1/23]
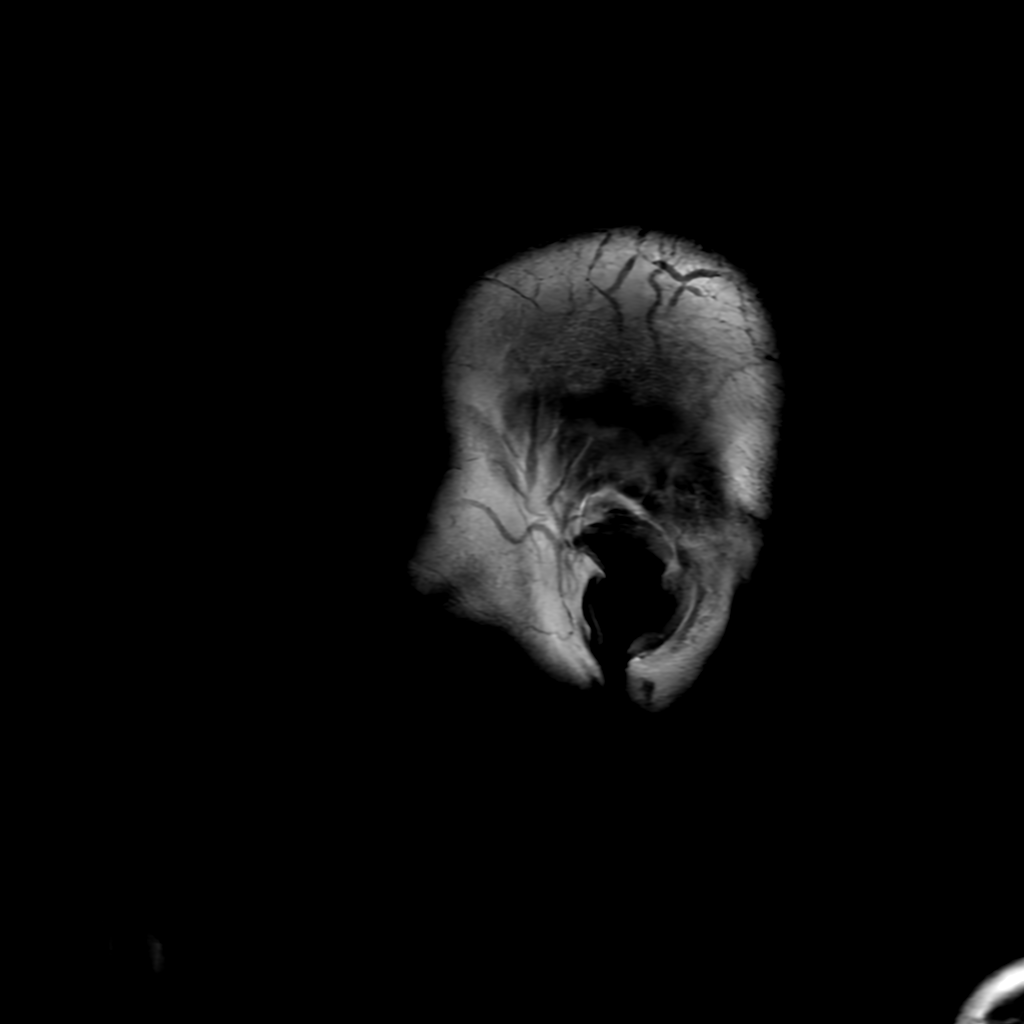
[im 23/23]
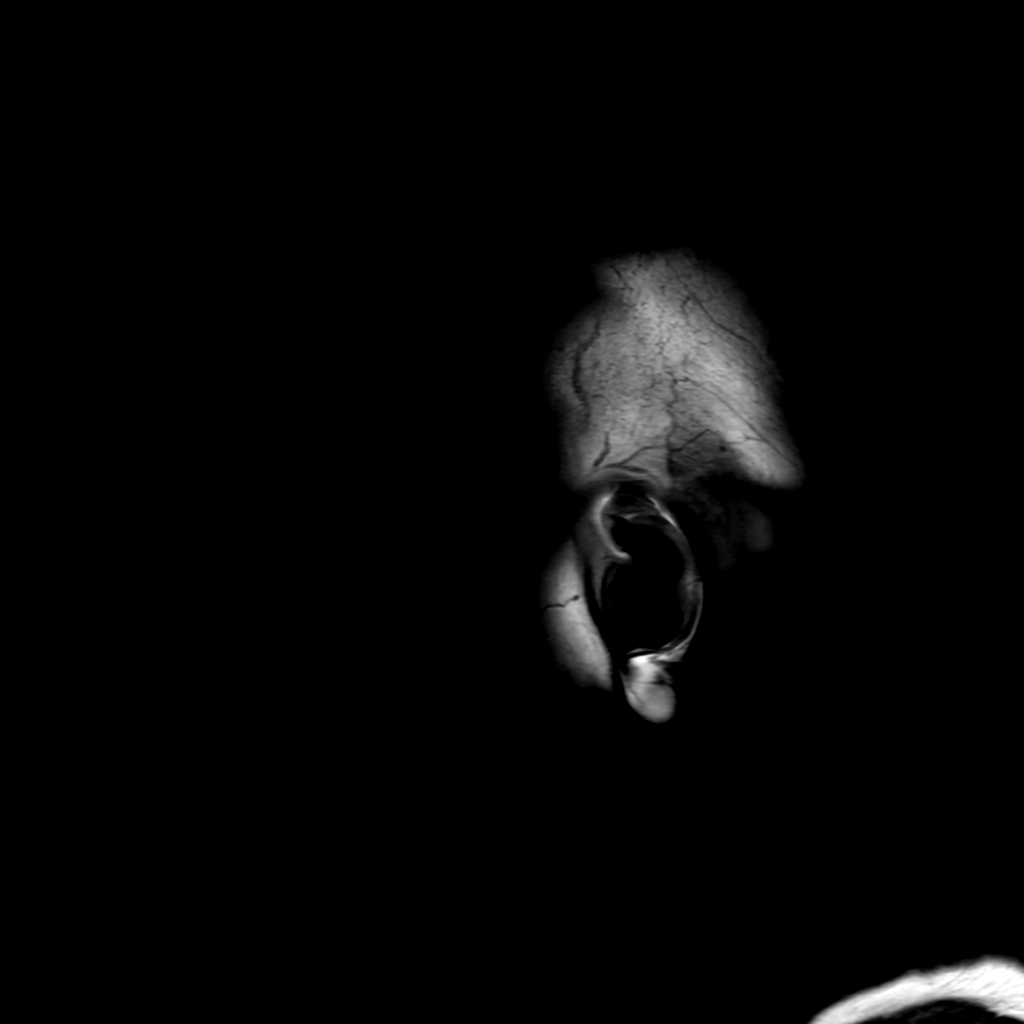

[Series 6: T2 · axial · 5.0mm · 0.23mm/px · 1 of 25 slices shown]
[im 1/25]
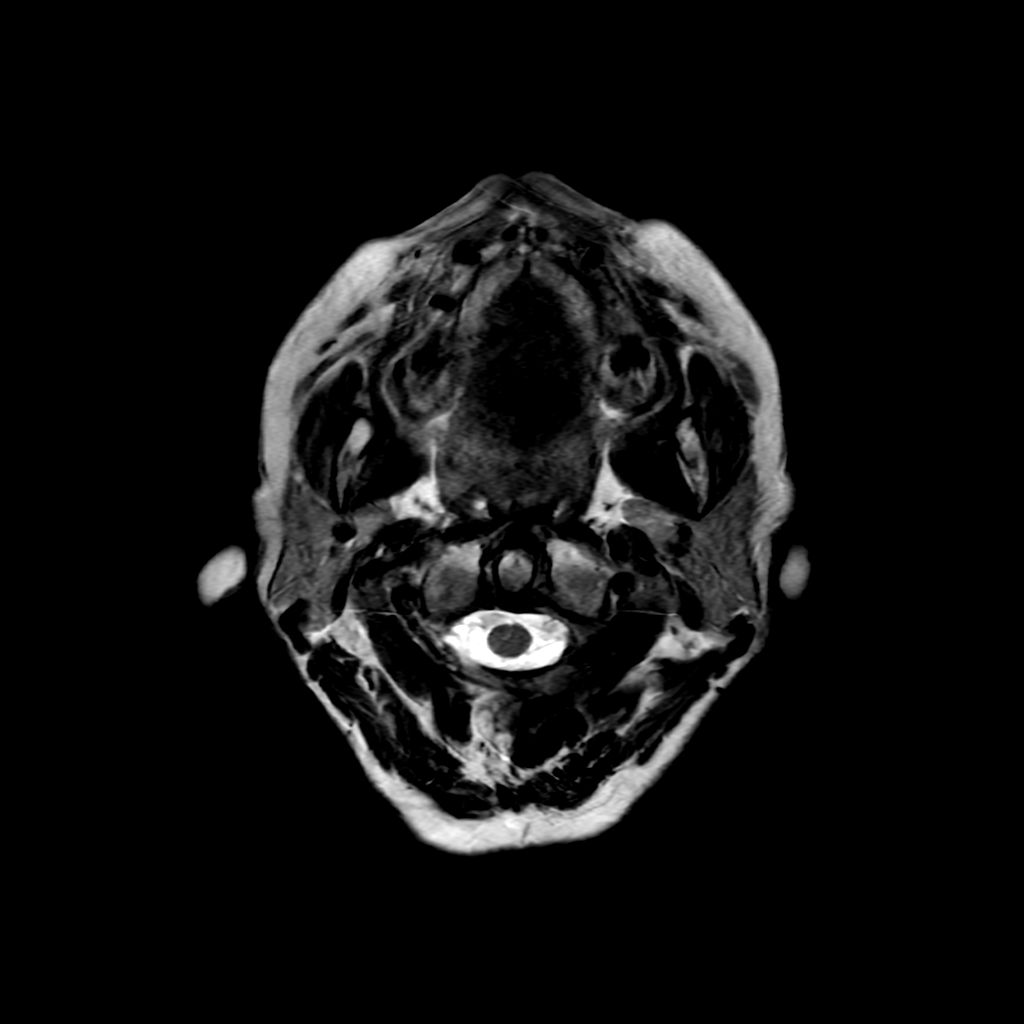

[Series 7: FLAIR · axial · 3.0mm · 0.41mm/px · z∈[-137,+0]mm · 2 of 24 slices shown (2 of 2)]
[im 1/24]
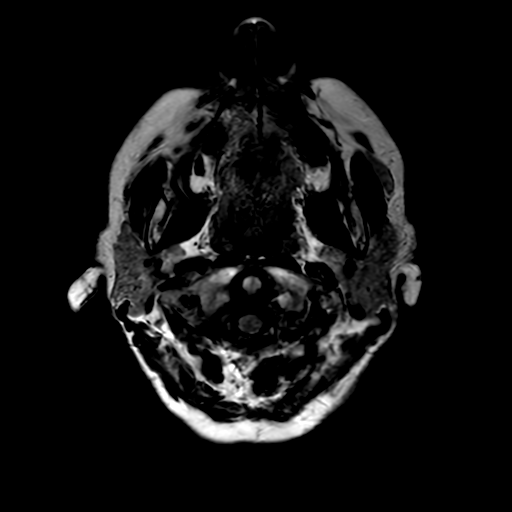
[im 24/24]
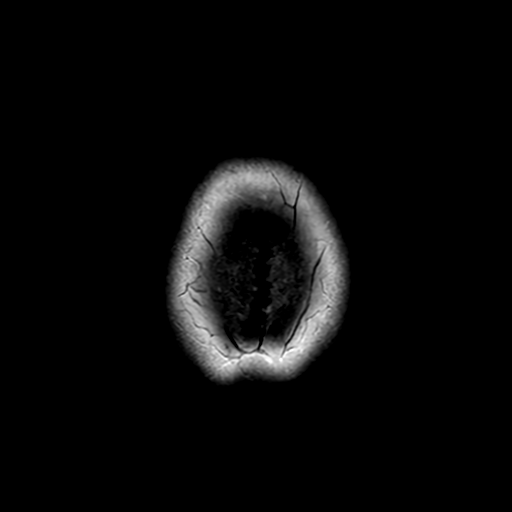

[Series 350: ADC · axial · 3.0mm · 0.94mm/px · z∈[-139,+5]mm · 4 of 49 slices shown (1 of 2)]
[im 1/49]
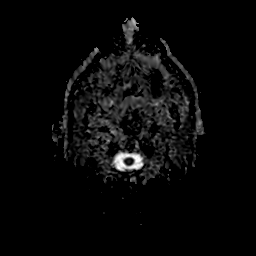
[im 17/49]
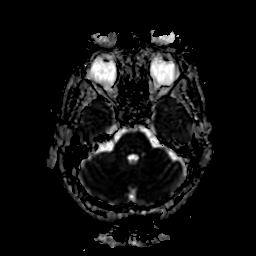
[im 33/49]
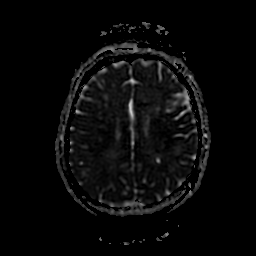
[im 49/49]
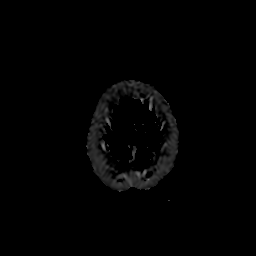

[Series 450: ADC · coronal · 4.0mm · 0.94mm/px · 3 of 35 slices shown (2 of 2)]
[im 1/35]
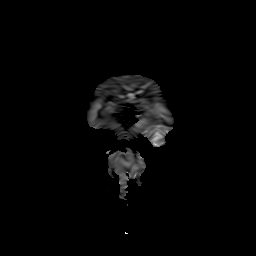
[im 18/35]
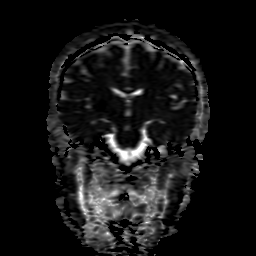
[im 35/35]
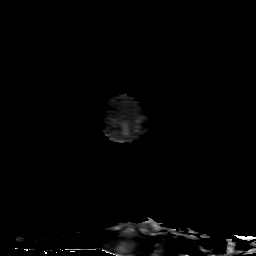

[25 of 48 positions shown; findings below may reference images not displayed]

FINDINGS: Brain: Triangle of restricted diffusion at the posterior right
putamen and restricted diffusion at the caudate body, likely a
perforator infarct involving the incomplete territory. There is
chronic small vessel ischemic type change in the cerebral white
matter with chronic lacune in the left cerebral hemisphere. Moderate
small-vessel disease also seen in the pons.

No acute hemorrhage, hydrocephalus, or intra-axial mass. An
arachnoid cyst at seen at the CP angle cistern on the right, 16 mm.

Vascular: Preserved flow voids.

Skull and upper cervical spine: Negative for marrow lesion

Sinuses/Orbits: Mucosal thickening in the paranasal sinuses, most
notably in the atelectatic right maxillary sinus. Concha bullosa on
the right. Partial right mastoid opacification with negative
nasopharynx.
IMPRESSION: 1. Acute right basal ganglia infarction.
2. Background of chronic small vessel ischemia in the cerebral white
matter and pons.

## 2020-08-01 ENCOUNTER — Telehealth: Payer: Self-pay

## 2020-08-01 NOTE — Telephone Encounter (Signed)
[facility] faxed refill request for the following medications: ° °amLODipine (NORVASC) 10 MG tablet  ° ° °Please advise. °

## 2020-08-02 ENCOUNTER — Other Ambulatory Visit: Payer: Self-pay

## 2020-08-02 MED ORDER — AMLODIPINE BESYLATE 10 MG PO TABS: 10.0000 mg | ORAL_TABLET | Freq: Every day | ORAL | 0 refills | Status: DC

## 2020-08-25 ENCOUNTER — Other Ambulatory Visit: Payer: Self-pay | Admitting: Family Medicine

## 2020-08-25 NOTE — Telephone Encounter (Signed)
Requested Prescriptions  Pending Prescriptions Disp Refills  . atorvastatin (LIPITOR) 80 MG tablet [Pharmacy Med Name: ATORVASTATIN 80MG  TABLETS] 90 tablet 0    Sig: TAKE 1 TABLET(80 MG) BY MOUTH DAILY AT 6 PM     Cardiovascular:  Antilipid - Statins Failed - 08/25/2020  6:24 AM      Failed - LDL in normal range and within 360 days    LDL Chol Calc (NIH)  Date Value Ref Range Status  11/12/2019 41 0 - 99 mg/dL Final         Passed - Total Cholesterol in normal range and within 360 days    Cholesterol, Total  Date Value Ref Range Status  11/12/2019 100 100 - 199 mg/dL Final         Passed - HDL in normal range and within 360 days    HDL  Date Value Ref Range Status  11/12/2019 43 >39 mg/dL Final         Passed - Triglycerides in normal range and within 360 days    Triglycerides  Date Value Ref Range Status  11/12/2019 75 0 - 149 mg/dL Final         Passed - Patient is not pregnant      Passed - Valid encounter within last 12 months    Recent Outpatient Visits          9 months ago Essential hypertension   01/13/2020, [facility], PA-C   11 months ago Dental abscess   [facility] Cipro, [facility], MD   1 year ago Essential hypertension   [facility] [place], [place], MD             . hydrALAZINE (APRESOLINE) 50 MG tablet [Pharmacy Med Name: HYDRALAZINE  50MG  TABLETS(ORANGE)] 270 tablet 0    Sig: TAKE 1 TABLET(50 MG) BY MOUTH THREE TIMES DAILY     Cardiovascular:  Vasodilators Passed - 08/25/2020  6:24 AM      Passed - HCT in normal range and within 360 days    Hematocrit  Date Value Ref Range Status  11/12/2019 38.0 34.0 - 46.6 % Final         Passed - HGB in normal range and within 360 days    Hemoglobin  Date Value Ref Range Status  11/12/2019 12.5 11.1 - 15.9 g/dL Final         Passed - RBC in normal range and within 360 days    RBC  Date Value Ref Range Status  11/12/2019 4.26 3.77 - 5.28  x10E6/uL Final  07/21/2019 4.98 3.87 - 5.11 MIL/uL Final         Passed - WBC in normal range and within 360 days    WBC  Date Value Ref Range Status  11/12/2019 8.7 3.4 - 10.8 x10E3/uL Final  07/21/2019 13.2 (H) 4.0 - 10.5 K/uL Final         Passed - PLT in normal range and within 360 days    Platelets  Date Value Ref Range Status  11/12/2019 291 150 - 450 x10E3/uL Final         Passed - Last BP in normal range    BP Readings from Last 1 Encounters:  07/04/20 (!) 108/50         Passed - Valid encounter within last 12 months    Recent Outpatient Visits          9 months ago Essential hypertension   [facility], Jodell Cipro, PA-C   11 months ago Dental abscess   [facility] [place], Marzella Schlein, MD   1 year ago Essential hypertension   [facility] [place], Marzella Schlein, MD

## 2020-09-15 ENCOUNTER — Telehealth: Payer: Self-pay

## 2020-09-15 NOTE — Telephone Encounter (Signed)
Okay to hold aspirin 5 days before procedure and resume 2 days after procedure

## 2020-09-15 NOTE — Telephone Encounter (Signed)
Crystal advised 

## 2020-09-15 NOTE — Telephone Encounter (Signed)
Copied from CRM [id number]. Topic: General - Inquiry >> Sep 15, 2020  9:09 AM Daphine Deutscher D wrote: Reason for CRM: pt's daughter Aggie Cosier called saying mom is getting dentures next Thursday and the dentist wants to know when the patient should stop taking her Asprin.  CB# [phone]

## 2020-09-29 ENCOUNTER — Telehealth: Payer: Medicare Other | Admitting: Family Medicine

## 2020-10-12 ENCOUNTER — Other Ambulatory Visit: Payer: Self-pay | Admitting: Cardiovascular Disease

## 2020-10-25 ENCOUNTER — Other Ambulatory Visit: Payer: Self-pay | Admitting: Family Medicine

## 2020-10-25 NOTE — Telephone Encounter (Signed)
   Notes to clinic: Patient canceled appt on 09/29/2020 Due for 6 month appt Review for refill    Requested Prescriptions  Pending Prescriptions Disp Refills   amLODipine (NORVASC) 10 MG tablet [Pharmacy Med Name: AMLODIPINE BESYLATE 10MG  TABLETS] 90 tablet 0    Sig: TAKE 1 TABLET(10 MG) BY MOUTH DAILY      Cardiovascular:  Calcium Channel Blockers Failed - 10/25/2020  3:01 PM      Failed - Valid encounter within last 6 months    Recent Outpatient Visits           11 months ago Essential hypertension    [facility], 10/27/2020, PA-C   1 year ago Dental abscess   [facility] [place], [place], MD   1 year ago Essential hypertension   [facility] [place], [place], MD                Passed - Last BP in normal range    BP Readings from Last 1 Encounters:  07/04/20 (!) 108/50

## 2020-11-23 ENCOUNTER — Other Ambulatory Visit: Payer: Self-pay | Admitting: Family Medicine

## 2020-11-23 NOTE — Telephone Encounter (Signed)
Requested medication (s) are due for refill today: yes   Requested medication (s) are on the active medication list: yes   Last refill:  10/25/2020  Future visit scheduled: no  Notes to clinic:  message has been sent to patient to contact office  Overdue for follow up    Requested Prescriptions  Pending Prescriptions Disp Refills   amLODipine (NORVASC) 10 MG tablet [Pharmacy Med Name: AMLODIPINE BESYLATE 10MG  TABLETS] 90 tablet     Sig: TAKE 1 TABLET(10 MG) BY MOUTH DAILY      Cardiovascular:  Calcium Channel Blockers Failed - 11/23/2020  8:14 AM      Failed - Valid encounter within last 6 months    Recent Outpatient Visits           1 year ago Essential hypertension   [facility], 11/25/2020, PA-C   1 year ago Dental abscess   [facility] [place], [place], MD   1 year ago Essential hypertension   [facility] [place], [place], MD                Passed - Last BP in normal range    BP Readings from Last 1 Encounters:  07/04/20 (!) 108/50

## 2020-11-23 NOTE — Telephone Encounter (Signed)
Requested medication (s) are due for refill today: yes   Requested medication (s) are on the active medication list: yes   Last refill:  08/25/2020  Future visit scheduled: no  Notes to clinic:  overdue for office visit Message sent to patient to contact office    Requested Prescriptions  Pending Prescriptions Disp Refills   atorvastatin (LIPITOR) 80 MG tablet [Pharmacy Med Name: ATORVASTATIN 80MG  TABLETS] 90 tablet 0    Sig: TAKE 1 TABLET(80 MG) BY MOUTH DAILY AT 6 PM      Cardiovascular:  Antilipid - Statins Failed - 11/23/2020  6:23 AM      Failed - Total Cholesterol in normal range and within 360 days    Cholesterol, Total  Date Value Ref Range Status  11/12/2019 100 100 - 199 mg/dL Final          Failed - LDL in normal range and within 360 days    LDL Chol Calc (NIH)  Date Value Ref Range Status  11/12/2019 41 0 - 99 mg/dL Final          Failed - HDL in normal range and within 360 days    HDL  Date Value Ref Range Status  11/12/2019 43 >39 mg/dL Final          Failed - Triglycerides in normal range and within 360 days    Triglycerides  Date Value Ref Range Status  11/12/2019 75 0 - 149 mg/dL Final          Failed - Valid encounter within last 12 months    Recent Outpatient Visits           1 year ago Essential hypertension   [facility], 01/13/2020, PA-C   1 year ago Dental abscess   [facility] [place], [place], MD   1 year ago Essential hypertension   [facility] [place], Marzella Schlein, MD                Passed - Patient is not pregnant        hydrALAZINE (APRESOLINE) 50 MG tablet [Pharmacy Med Name: HYDRALAZINE  50MG  TABLETS(ORANGE)] 270 tablet 0    Sig: TAKE 1 TABLET(50 MG) BY MOUTH THREE TIMES DAILY      Cardiovascular:  Vasodilators Failed - 11/23/2020  6:23 AM      Failed - HCT in normal range and within 360 days    Hematocrit  Date Value Ref Range Status  11/12/2019 38.0 34.0 -  46.6 % Final          Failed - HGB in normal range and within 360 days    Hemoglobin  Date Value Ref Range Status  11/12/2019 12.5 11.1 - 15.9 g/dL Final          Failed - RBC in normal range and within 360 days    RBC  Date Value Ref Range Status  11/12/2019 4.26 3.77 - 5.28 x10E6/uL Final  07/21/2019 4.98 3.87 - 5.11 MIL/uL Final          Failed - WBC in normal range and within 360 days    WBC  Date Value Ref Range Status  11/12/2019 8.7 3.4 - 10.8 x10E3/uL Final  07/21/2019 13.2 (H) 4.0 - 10.5 K/uL Final          Failed - PLT in normal range and within 360 days    Platelets  Date Value Ref Range Status  11/12/2019 291 150 - 450 x10E3/uL Final  Failed - Valid encounter within last 12 months    Recent Outpatient Visits           1 year ago Essential hypertension   [facility] Chr[facility], Jodell Cipro, PA-C   1 year ago Dental abscess   [facility] [place], Marzella Schlein, MD   1 year ago Essential hypertension   [facility] [place], Marzella Schlein, MD                Passed - Last BP in normal range    BP Readings from Last 1 Encounters:  07/04/20 (!) 108/50

## 2020-11-29 ENCOUNTER — Telehealth: Payer: Self-pay

## 2020-11-29 MED ORDER — MOLNUPIRAVIR EUA 200MG CAPSULE: 4.0000 | ORAL_CAPSULE | Freq: Two times a day (BID) | ORAL | 0 refills | Status: AC

## 2020-11-29 NOTE — Telephone Encounter (Signed)
Copied from CRM [id number]. Topic: General - Other >> Nov 29, 2020  1:29 PM Jaquita Rector A wrote: Reason for CRM: Patient daughter Aggie Cosier called in to say that patient tested positive for Covid on 11/25/20 and hat patient is doing ok at times but feels weak due to not being able to eat much. Had a fever,  mild productive cough, diarrhea would like to know if there is something to be prescribed. Please call Crystal at Ph# [phone]

## 2020-11-29 NOTE — Telephone Encounter (Signed)
Please review. Ok to send in antiviral?

## 2020-11-29 NOTE — Telephone Encounter (Signed)
Ok to send in [place] under EUA.  She is also overdue for chronic disease f/u - consider scheduling a visit after 10 days

## 2020-11-29 NOTE — Telephone Encounter (Signed)
RX sent to pharmacy, Crystal (Pt's daughter advised.)  Thanks,   -Vernona Rieger

## 2020-12-23 ENCOUNTER — Telehealth: Payer: Self-pay | Admitting: Family Medicine

## 2020-12-23 NOTE — Telephone Encounter (Signed)
Notes to clinic:  Patient requests 90 days supply   Requested Prescriptions  Pending Prescriptions Disp Refills   atorvastatin (LIPITOR) 80 MG tablet [Pharmacy Med Name: ATORVASTATIN 80MG  TABLETS] 90 tablet     Sig: TAKE 1 TABLET(80 MG) BY MOUTH DAILY AT 6 PM     Cardiovascular:  Antilipid - Statins Failed - 12/23/2020  6:24 AM      Failed - Total Cholesterol in normal range and within 360 days    Cholesterol, Total  Date Value Ref Range Status  11/12/2019 100 100 - 199 mg/dL Final          Failed - LDL in normal range and within 360 days    LDL Chol Calc (NIH)  Date Value Ref Range Status  11/12/2019 41 0 - 99 mg/dL Final          Failed - HDL in normal range and within 360 days    HDL  Date Value Ref Range Status  11/12/2019 43 >39 mg/dL Final          Failed - Triglycerides in normal range and within 360 days    Triglycerides  Date Value Ref Range Status  11/12/2019 75 0 - 149 mg/dL Final          Failed - Valid encounter within last 12 months    Recent Outpatient Visits           1 year ago Essential hypertension   [facility] Chrismon, 01/13/2020, PA-C   1 year ago Dental abscess   [facility] [place], [place], MD   1 year ago Essential hypertension   [facility] [place], Ken[place], MD              Passed - Patient is not pregnant       amLODipine (NORVASC) 10 MG tablet [Pharmacy Med Name: AMLODIPINE BESYLATE 10MG  TABLETS] 90 tablet     Sig: TAKE 1 TABLET(10 MG) BY MOUTH DAILY     Cardiovascular:  Calcium Channel Blockers Failed - 12/23/2020  6:24 AM      Failed - Valid encounter within last 6 months    Recent Outpatient Visits           1 year ago Essential hypertension   [facility] Chrismon, , PA-C   1 year ago Dental abscess   [facility] [place], [facility], MD   1 year ago Essential hypertension   [facility] [place], [facility],  MD              Passed - Last BP in normal range    BP Readings from Last 1 Encounters:  07/04/20 (!) 108/50           hydrALAZINE (APRESOLINE) 50 MG tablet [Pharmacy Med Name: HYDRALAZINE  50MG  TABLETS(ORANGE)] 270 tablet     Sig: TAKE 1 TABLET(50 MG) BY MOUTH THREE TIMES DAILY     Cardiovascular:  Vasodilators Failed - 12/23/2020  6:24 AM      Failed - HCT in normal range and within 360 days    Hematocrit  Date Value Ref Range Status  11/12/2019 38.0 34.0 - 46.6 % Final          Failed - HGB in normal range and within 360 days    Hemoglobin  Date Value Ref Range Status  11/12/2019 12.5 11.1 - 15.9 g/dL Final          Failed - RBC in normal  range and within 360 days    RBC  Date Value Ref Range Status  11/12/2019 4.26 3.77 - 5.28 x10E6/uL Final  07/21/2019 4.98 3.87 - 5.11 MIL/uL Final          Failed - WBC in normal range and within 360 days    WBC  Date Value Ref Range Status  11/12/2019 8.7 3.4 - 10.8 x10E3/uL Final  07/21/2019 13.2 (H) 4.0 - 10.5 K/uL Final          Failed - PLT in normal range and within 360 days    Platelets  Date Value Ref Range Status  11/12/2019 291 150 - 450 x10E3/uL Final          Failed - Valid encounter within last 12 months    Recent Outpatient Visits           1 year ago Essential hypertension   [facility] Chrismon, Jodell Cipro, PA-C   1 year ago Dental abscess   [facility] [place], Marzella Schlein, MD   1 year ago Essential hypertension   [facility] [place], Marzella Schlein, MD              Passed - Last BP in normal range    BP Readings from Last 1 Encounters:  07/04/20 (!) 108/50

## 2020-12-23 NOTE — Telephone Encounter (Signed)
Have her set up a follow-up appointment and then we can send in refills.

## 2020-12-26 NOTE — Telephone Encounter (Signed)
Lmtcb to schedule appt.

## 2020-12-30 MED ORDER — HYDRALAZINE HCL 50 MG PO TABS: ORAL_TABLET | ORAL | 0 refills | Status: DC

## 2020-12-30 MED ORDER — ATORVASTATIN CALCIUM 80 MG PO TABS: ORAL_TABLET | ORAL | 0 refills | Status: DC

## 2020-12-30 MED ORDER — AMLODIPINE BESYLATE 10 MG PO TABS: 10.0000 mg | ORAL_TABLET | Freq: Every day | ORAL | 0 refills | Status: DC

## 2020-12-30 NOTE — Telephone Encounter (Signed)
Pt called stating that she has an appt with Dortha Kern on 01/06/21. Please advise.

## 2020-12-30 NOTE — Addendum Note (Signed)
Addended by: Hyacinth Meeker on: 12/30/2020 01:19 PM   Modules accepted: Orders

## 2021-01-06 ENCOUNTER — Ambulatory Visit (INDEPENDENT_AMBULATORY_CARE_PROVIDER_SITE_OTHER): Payer: Medicare Other | Admitting: Family Medicine

## 2021-01-06 ENCOUNTER — Encounter: Payer: Self-pay | Admitting: Family Medicine

## 2021-01-06 ENCOUNTER — Other Ambulatory Visit: Payer: Self-pay

## 2021-01-06 VITALS — BP 123/64 | HR 62 | Resp 16 | Ht 60.0 in | Wt 84.4 lb

## 2021-01-06 DIAGNOSIS — Z8639 Personal history of other endocrine, nutritional and metabolic disease: Secondary | ICD-10-CM

## 2021-01-06 DIAGNOSIS — I10 Essential (primary) hypertension: Secondary | ICD-10-CM | POA: Diagnosis not present

## 2021-01-06 DIAGNOSIS — E782 Mixed hyperlipidemia: Secondary | ICD-10-CM | POA: Diagnosis not present

## 2021-01-06 DIAGNOSIS — Z8673 Personal history of transient ischemic attack (TIA), and cerebral infarction without residual deficits: Secondary | ICD-10-CM

## 2021-01-06 NOTE — Progress Notes (Signed)
I,April Miller,acting as a Neurosurgeon for [employer], PA-C.,have documented all relevant documentation on the behalf of Jasmine Kern, PA-C,as directed by  [employer], PA-C while in the presence of [employer], PA-C.   Established patient visit   Patient: Jasmine [name]   DOB: [birth year]   70 y.o. Female  MRN: [id number] Visit Date: 01/06/2021  Today's healthcare provider: Dortha Kern, PA-C   Chief Complaint  Patient presents with   Follow-up   Hyperlipidemia   Subjective  -------------------------------------------------------------------------------------------------------------------- HPI  Hypertension, follow-up  BP Readings from Last 3 Encounters:  01/06/21 123/64  07/04/20 (!) 108/50  11/12/19 125/68   Wt Readings from Last 3 Encounters:  01/06/21 84 lb 6.4 oz (38.3 kg)  07/04/20 93 lb 6.4 oz (42.4 kg)  11/12/19 98 lb (44.5 kg)     She was last seen for hypertension 11/12/2019.  BP at that visit was 108/50. Management since that visit includes; on amlodipine and losartan. She reports good compliance with treatment. She is not having side effects. none She is exercising. She is adherent to low salt diet.   Outside blood pressures are 140/75.  She does issmoke.  Use of agents associated with hypertension: none.   ---------------------------------------------------------------------------------------------------  Lipid/Cholesterol, follow-up  Last Lipid Panel: Lab Results  Component Value Date   CHOL 100 11/12/2019   LDLCALC 41 11/12/2019   HDL 43 11/12/2019   TRIG 75 11/12/2019    She was last seen for this 11/12/2019.  Management since that visit includes; on atorvastatin  She reports fair compliance with treatment. She is not having side effects. none  She is following a Regular diet. Current exercise: none  Last metabolic panel Lab Results  Component Value Date   GLUCOSE 91 11/12/2019   NA 136 11/12/2019   K 4.0  11/12/2019   BUN 6 (L) 11/12/2019   CREATININE 0.60 11/12/2019   GFRNONAA 95 11/12/2019   GFRAA 109 11/12/2019   CALCIUM 9.7 11/12/2019   AST 22 11/12/2019   ALT 22 11/12/2019   The ASCVD Risk score (Goff DC Jr., et al., 2013) failed to calculate for the following reasons:   The patient has a prior MI or stroke diagnosis  ---------------------------------------------------------------------------------------------------  Past Medical History:  Diagnosis Date   Basal ganglia stroke (HCC)    Hyperlipidemia    Hypertension    Hypertensive emergency    Left wrist fracture    Past Surgical History:  Procedure Laterality Date   TUBAL LIGATION  1981   Family History  Problem Relation Age of Onset   Cancer Mother    Cancer Father    Cancer Sister        throat cancer   Social History   Tobacco Use   Smoking status: Every Day    Packs/day: 0.75    Years: 55.00    Pack years: 41.25    Types: Cigarettes   Smokeless tobacco: Never  Vaping Use   Vaping Use: Never used  Substance Use Topics   Alcohol use: Not Currently   Drug use: Not Currently   No Known Allergies  Medications: Outpatient Medications Prior to Visit  Medication Sig   amLODipine (NORVASC) 10 MG tablet Take 1 tablet (10 mg total) by mouth daily. Please schedule an office visit before anymore refills.   aspirin 81 MG EC tablet Take 1 tablet (81 mg total) by mouth daily.   atorvastatin (LIPITOR) 80 MG tablet TAKE 1 TABLET(80 MG) BY MOUTH DAILY AT 6 PM  hydrALAZINE (APRESOLINE) 50 MG tablet TAKE 1 TABLET(50 MG) BY MOUTH THREE TIMES DAILY   losartan (COZAAR) 100 MG tablet Take 0.5 tablets (50 mg total) by mouth daily. Please schedule office visit for further refills. Thank you!   Multiple Vitamins-Minerals (EMERGEN-C IMMUNE PLUS/VIT D PO) Take 1 tablet by mouth daily at 6 (six) AM. With zinc   Probiotic Product (PROBIOTIC-10 ULTIMATE) CAPS Take 1 capsule by mouth daily.   cholecalciferol (VITAMIN D3) 25 MCG  (1000 UNIT) tablet Take 1,000 Units by mouth daily. (Patient not taking: No sig reported)   No facility-administered medications prior to visit.    Review of Systems  Constitutional:  Negative for appetite change, chills, fatigue and fever.  Respiratory:  Negative for chest tightness and shortness of breath.   Cardiovascular:  Negative for chest pain and palpitations.  Gastrointestinal:  Negative for abdominal pain, nausea and vomiting.  Neurological:  Negative for dizziness and weakness.      Objective  -------------------------------------------------------------------------------------------------------------------- BP 123/64 (BP Location: Right Arm, Patient Position: Sitting, Cuff Size: Small)   Pulse 62   Resp 16   Ht 5' (1.524 m)   Wt 84 lb 6.4 oz (38.3 kg)   SpO2 97%   BMI 16.48 kg/m  BP Readings from Last 3 Encounters:  01/06/21 123/64  07/04/20 (!) 108/50  11/12/19 125/68   Wt Readings from Last 3 Encounters:  01/06/21 84 lb 6.4 oz (38.3 kg)  07/04/20 93 lb 6.4 oz (42.4 kg)  11/12/19 98 lb (44.5 kg)   Physical Exam Constitutional:      General: She is not in acute distress.    Appearance: She is well-developed.  HENT:     Head: Normocephalic and atraumatic.     Right Ear: Hearing and tympanic membrane normal.     Left Ear: Hearing and tympanic membrane normal.     Nose: Nose normal.  Eyes:     General: Lids are normal. No scleral icterus.       Right eye: No discharge.        Left eye: No discharge.     Conjunctiva/sclera: Conjunctivae normal.  Cardiovascular:     Rate and Rhythm: Normal rate and regular rhythm.     Pulses: Normal pulses.     Heart sounds: Normal heart sounds.  Pulmonary:     Effort: Pulmonary effort is normal. No respiratory distress.     Breath sounds: Normal breath sounds.  Abdominal:     General: Bowel sounds are normal.     Palpations: Abdomen is soft.  Musculoskeletal:        General: Normal range of motion.  Skin:     Findings: No lesion or rash.  Neurological:     Mental Status: She is alert and oriented to person, place, and time.  Psychiatric:        Speech: Speech normal.        Behavior: Behavior normal.        Thought Content: Thought content normal.     No results found for any visits on 01/06/21.  Assessment & Plan  ---------------------------------------------------------------------------------------------------------------------- 1. Mixed hyperlipidemia Still taking Atorvastatin 80 mg qd without side effects. Has lost weight due to COVID infection in July 2022. Need to regain weight and energy. Recheck labs. - Lipid panel - TSH - Comprehensive Metabolic Panel (CMET)  2. Essential hypertension Well controlled BP. Still tolerating the Amlodipine, Hydralazine and Losartan qd. Check follow up labs. - Lipid panel - TSH - CBC w/Diff/Platelet - Comprehensive Metabolic  Panel (CMET)  3. History of hypokalemia No muscle cramp. - Comprehensive Metabolic Panel (CMET)  4. History of CVA (cerebrovascular accident) Very good recovery of left sided weakness. No aphasia or facial droop. - CBC w/Diff/Platelet - Comprehensive Metabolic Panel (CMET)   No follow-ups on file.      I, Kemiya Batdorf, PA-C, have reviewed all documentation for this visit. The documentation on 01/06/21 for the exam, diagnosis, procedures, and orders are all accurate and complete.    Jasmine Kern, PA-C  [facility] [phone] (phone) [phone] (fax)  [facility]

## 2021-01-10 DIAGNOSIS — E782 Mixed hyperlipidemia: Secondary | ICD-10-CM | POA: Diagnosis not present

## 2021-01-10 DIAGNOSIS — I10 Essential (primary) hypertension: Secondary | ICD-10-CM | POA: Diagnosis not present

## 2021-01-10 DIAGNOSIS — Z8639 Personal history of other endocrine, nutritional and metabolic disease: Secondary | ICD-10-CM | POA: Diagnosis not present

## 2021-01-10 DIAGNOSIS — Z8673 Personal history of transient ischemic attack (TIA), and cerebral infarction without residual deficits: Secondary | ICD-10-CM | POA: Diagnosis not present

## 2021-01-11 LAB — COMPREHENSIVE METABOLIC PANEL
ALT: 22 IU/L (ref 0–32)
AST: 22 IU/L (ref 0–40)
Albumin/Globulin Ratio: 2.5 — ABNORMAL HIGH (ref 1.2–2.2)
Albumin: 4 g/dL (ref 3.8–4.8)
Alkaline Phosphatase: 101 IU/L (ref 44–121)
BUN/Creatinine Ratio: 13 (ref 12–28)
BUN: 7 mg/dL — ABNORMAL LOW (ref 8–27)
Bilirubin Total: 0.4 mg/dL (ref 0.0–1.2)
CO2: 18 mmol/L — ABNORMAL LOW (ref 20–29)
Calcium: 9.6 mg/dL (ref 8.7–10.3)
Chloride: 109 mmol/L — ABNORMAL HIGH (ref 96–106)
Creatinine, Ser: 0.54 mg/dL — ABNORMAL LOW (ref 0.57–1.00)
Globulin, Total: 1.6 g/dL (ref 1.5–4.5)
Glucose: 100 mg/dL — ABNORMAL HIGH (ref 65–99)
Potassium: 3.9 mmol/L (ref 3.5–5.2)
Sodium: 140 mmol/L (ref 134–144)
Total Protein: 5.6 g/dL — ABNORMAL LOW (ref 6.0–8.5)
eGFR: 100 mL/min/{1.73_m2} (ref >59)

## 2021-01-11 LAB — CBC WITH DIFFERENTIAL/PLATELET
Basophils Absolute: 0.1 10*3/uL (ref 0.0–0.2)
Basos: 1 %
EOS (ABSOLUTE): 0.2 10*3/uL (ref 0.0–0.4)
Eos: 3 %
Hematocrit: 37.2 % (ref 34.0–46.6)
Hemoglobin: 12.5 g/dL (ref 11.1–15.9)
Immature Grans (Abs): 0 10*3/uL (ref 0.0–0.1)
Immature Granulocytes: 0 %
Lymphocytes Absolute: 1.4 10*3/uL (ref 0.7–3.1)
Lymphs: 21 %
MCH: 30.9 pg (ref 26.6–33.0)
MCHC: 33.6 g/dL (ref 31.5–35.7)
MCV: 92 fL (ref 79–97)
Monocytes Absolute: 0.8 10*3/uL (ref 0.1–0.9)
Monocytes: 12 %
Neutrophils Absolute: 4.1 10*3/uL (ref 1.4–7.0)
Neutrophils: 63 %
Platelets: 268 10*3/uL (ref 150–450)
RBC: 4.04 x10E6/uL (ref 3.77–5.28)
RDW: 12.6 % (ref 11.7–15.4)
WBC: 6.5 10*3/uL (ref 3.4–10.8)

## 2021-01-11 LAB — LIPID PANEL
Chol/HDL Ratio: 2.1 ratio (ref 0.0–4.4)
Cholesterol, Total: 97 mg/dL — ABNORMAL LOW (ref 100–199)
HDL: 47 mg/dL (ref >39)
LDL Chol Calc (NIH): 39 mg/dL (ref 0–99)
Triglycerides: 39 mg/dL (ref 0–149)
VLDL Cholesterol Cal: 11 mg/dL (ref 5–40)

## 2021-01-11 LAB — TSH: TSH: 3.14 u[IU]/mL (ref 0.450–4.500)

## 2021-01-22 ENCOUNTER — Other Ambulatory Visit: Payer: Self-pay | Admitting: Family Medicine

## 2021-02-07 ENCOUNTER — Other Ambulatory Visit: Payer: Self-pay

## 2021-02-07 ENCOUNTER — Ambulatory Visit: Payer: Medicare Other | Admitting: Cardiovascular Disease

## 2021-02-07 ENCOUNTER — Encounter: Payer: Self-pay | Admitting: Cardiovascular Disease

## 2021-02-07 VITALS — BP 140/62 | HR 65 | Ht 60.0 in | Wt 84.0 lb

## 2021-02-07 DIAGNOSIS — Z8673 Personal history of transient ischemic attack (TIA), and cerebral infarction without residual deficits: Secondary | ICD-10-CM

## 2021-02-07 DIAGNOSIS — E782 Mixed hyperlipidemia: Secondary | ICD-10-CM | POA: Diagnosis not present

## 2021-02-07 DIAGNOSIS — F17200 Nicotine dependence, unspecified, uncomplicated: Secondary | ICD-10-CM

## 2021-02-07 DIAGNOSIS — I10 Essential (primary) hypertension: Secondary | ICD-10-CM | POA: Diagnosis not present

## 2021-02-07 MED ORDER — LOSARTAN POTASSIUM 50 MG PO TABS: 50.0000 mg | ORAL_TABLET | Freq: Every day | ORAL | 0 refills | Status: DC

## 2021-02-07 MED ORDER — LOSARTAN POTASSIUM 50 MG PO TABS: 50.0000 mg | ORAL_TABLET | Freq: Every day | ORAL | 3 refills | Status: DC

## 2021-02-07 MED ORDER — AMLODIPINE BESYLATE 10 MG PO TABS: ORAL_TABLET | ORAL | 3 refills | Status: DC

## 2021-02-07 MED ORDER — HYDRALAZINE HCL 50 MG PO TABS: ORAL_TABLET | ORAL | 3 refills | Status: DC

## 2021-02-07 MED ORDER — ATORVASTATIN CALCIUM 80 MG PO TABS: ORAL_TABLET | ORAL | 3 refills | Status: DC

## 2021-02-07 NOTE — Progress Notes (Signed)
Cardiology Office Note  Date:  02/07/2021   ID:  Jasmine [name], DOB 1950/07/13, MRN [id number]  PCP:  Erasmo Downer, MD   Chief Complaint  Patient presents with   Other    12 month follow up -- Meds reviewed verbally with patient.     HPI:  [name] is a 63 seen in clinic-year-old woman with past medical history of Chronic Hypertensive urgency Right basal ganglia infarct Smoker Who presents for routine follow-up for hypertension, history of stroke, smoker  Last seen in clinic July 2021 Still smoking, "cut down"  Recovered from recent Covid infection Also reports having tooth problems Through the events above, Lost 9 pounds Weight has needs stabilized, she feels better  Reports blood pressure stable on her current medication regiment Denies leg swelling, no chest pain, no significant shortness of breath  EKG personally reviewed by myself on todays visit Normal sinus rhythm rate 65 bpm PVCs no significant ST-T wave changes  Other past medical history reviewed hospital March 2021 with hypertensive emergency left-sided weakness dysarthria when getting out of bed Blood pressure 230/108 MRI acute right basal ganglia infarct Neurology felt  consistent with hypertensive encephalopathy  started on cleviprex along with amlodipine and HCTZ.  Overnight 3/14, cleviprex was weaned off   instructed by neurology to take aspirin Plavix for 3 weeks then stop Plavix and continue aspirin indefinitely  Echocardiogram July 18, 2019 Ejection fraction 60 to 65%, moderate LVH Grade 1 diastolic dysfunction  CT scan results reviewed Atherosclerotic plaque  affecting both common carotid arteries without measurable stenosis.  Soft and calcified plaque at both carotid bifurcations and ICA bulbs but without measurable stenosis. No posterior circulation pathology. Emphysema.    PMH:   has a past medical history of Basal ganglia stroke (HCC), Hyperlipidemia,  Hypertension, Hypertensive emergency, and Left wrist fracture.  PSH:    Past Surgical History:  Procedure Laterality Date   TUBAL LIGATION  1981    Current Outpatient Medications  Medication Sig Dispense Refill   aspirin 81 MG EC tablet Take 1 tablet (81 mg total) by mouth daily. 90 tablet 1   cholecalciferol (VITAMIN D3) 25 MCG (1000 UNIT) tablet Take 1,000 Units by mouth daily.     hydrALAZINE (APRESOLINE) 50 MG tablet TAKE 1 TABLET(50 MG) BY MOUTH THREE TIMES DAILY 90 tablet 2   Multiple Vitamins-Minerals (EMERGEN-C IMMUNE PLUS/VIT D PO) Take 1 tablet by mouth daily at 6 (six) AM. With zinc     Probiotic Product (PROBIOTIC-10 ULTIMATE) CAPS Take 1 capsule by mouth daily.     amLODipine (NORVASC) 10 MG tablet TAKE 1 TABLET(10 MG) BY MOUTH DAILY 90 tablet 3   atorvastatin (LIPITOR) 80 MG tablet TAKE 1 TABLET(80 MG) BY MOUTH DAILY AT 6 PM 90 tablet 3   losartan (COZAAR) 50 MG tablet Take 1 tablet (50 mg total) by mouth daily. 90 tablet 0   No current facility-administered medications for this visit.     Allergies:   Patient has no known allergies.   Social History:  The patient  reports that she has been smoking cigarettes. She has a 41.25 pack-year smoking history. She has never used smokeless tobacco. She reports that she does not currently use alcohol. She reports that she does not currently use drugs.   Family History:   family history includes Cancer in her father, mother, and sister.    Review of Systems: Review of Systems  Constitutional: Negative.   HENT: Negative.    Respiratory: Negative.  Cardiovascular: Negative.   Gastrointestinal: Negative.   Musculoskeletal: Negative.   Neurological: Negative.   Psychiatric/Behavioral: Negative.    All other systems reviewed and are negative.  PHYSICAL EXAM: VS:  BP 140/62 (BP Location: Left Arm, Patient Position: Sitting, Cuff Size: Normal)   Pulse 65   Ht 5' (1.524 m)   Wt 84 lb (38.1 kg)   SpO2 99%   BMI 16.41 kg/m   , BMI Body mass index is 16.41 kg/m. Constitutional:  oriented to person, place, and time. No distress.  HENT:  Head: Grossly normal Eyes:  no discharge. No scleral icterus.  Neck: No JVD, no carotid bruits  Cardiovascular: Regular rate and rhythm, no murmurs appreciated Pulmonary/Chest: Clear to auscultation bilaterally, no wheezes or rails Abdominal: Soft.  no distension.  no tenderness.  Musculoskeletal: Normal range of motion Neurological:  normal muscle tone. Coordination normal. No atrophy Skin: Skin warm and dry Psychiatric: normal affect, pleasant  Recent Labs: 01/10/2021: ALT 22; BUN 7; Creatinine, Ser 0.54; Hemoglobin 12.5; Platelets 268; Potassium 3.9; Sodium 140; TSH 3.140    Lipid Panel Lab Results  Component Value Date   CHOL 97 (L) 01/10/2021   HDL 47 01/10/2021   LDLCALC 39 01/10/2021   TRIG 39 01/10/2021      Wt Readings from Last 3 Encounters:  02/07/21 84 lb (38.1 kg)  01/06/21 84 lb 6.4 oz (38.3 kg)  07/04/20 93 lb 6.4 oz (42.4 kg)     ASSESSMENT AND PLAN:  Problem List Items Addressed This Visit       Cardiology Problems   Essential hypertension - Primary   Relevant Medications   losartan (COZAAR) 50 MG tablet   amLODipine (NORVASC) 10 MG tablet   atorvastatin (LIPITOR) 80 MG tablet   Hyperlipidemia   Relevant Medications   losartan (COZAAR) 50 MG tablet   amLODipine (NORVASC) 10 MG tablet   atorvastatin (LIPITOR) 80 MG tablet     Other   History of CVA (cerebrovascular accident)   Other Visit Diagnoses     Smoker         Hypertension/stroke Prior stroke felt secondary to chronic poorly controlled hypertension Moderate LVH on echocardiogram consistent with chronic hypertension HCTZ previously held for hyponatremia Blood pressure stable on current medications including amlodipine 10 mg daily with hydralazine 50 3 times daily, losartan 50 daily  Smoking We have encouraged her to continue to work on weaning her cigarettes and smoking  cessation. She will continue to work on this and does not want any assistance with chantix.    Hyperlipidemia On statin, cholesterol at goal    Total encounter time more than 25 minutes  Greater than 50% was spent in counseling and coordination of care with the patient    Signed, Dossie Arbour, M.D., Ph.D. [facility] [place], [place] [phone]

## 2021-02-07 NOTE — Patient Instructions (Addendum)
Medication Instructions:  No changes  If you need a refill on your cardiac medications before your next appointment, please call your pharmacy.   Lab work: No new labs needed  Testing/Procedures: No new testing needed  Follow-Up: At [facility], you and your health needs are our priority.  As part of our continuing mission to provide you with exceptional heart care, we have created designated Provider Care Teams.  These Care Teams include your primary Cardiologist (physician) and Advanced Practice Providers (APPs -  Physician Assistants and Nurse Practitioners) who all work together to provide you with the care you need, when you need it.  You will need a follow up appointment in 12 months  Providers on your designated Care Team:   Christopher Berge, NP Ryan Dunn, PA-C Jacquelyn Visser, PA-C Cadence Furth, PA-C  COVID-19 Vaccine Information can be found at: [email or web address] For questions related to vaccine distribution or appointments, please email [email or web address] or call [phone].    

## 2021-02-13 NOTE — Addendum Note (Signed)
Addended by: Margrett Rud on: 02/13/2021 08:03 AM   Modules accepted: Orders

## 2021-08-07 ENCOUNTER — Telehealth: Payer: Self-pay | Admitting: Family Medicine

## 2021-08-07 NOTE — Telephone Encounter (Signed)
Copied from CRM [id number]. Topic: Medicare AWV ?>> Aug 07, 2021 11:06 AM Claudette Laws R wrote: ?Reason for CRM:  ?Left message for patient to call back and schedule Medicare Annual Wellness Visit (AWV) in office.  ? ?If unable to come into the office for AWV,  please offer to do virtually or by telephone. ? ?Last AWV:   07/04/2020 ? ?Please schedule at anytime with [facility] Health Advisor. ? ?30 minute appointment for Virtual or phone ?45 minute appointment for in office or Initial virtual/phone ? ?Any questions, please contact me at [phone] ?

## 2021-09-05 ENCOUNTER — Ambulatory Visit (INDEPENDENT_AMBULATORY_CARE_PROVIDER_SITE_OTHER): Payer: Medicare Other

## 2021-09-05 VITALS — Wt 84.0 lb

## 2021-09-05 DIAGNOSIS — Z0000 Encounter for general adult medical examination without abnormal findings: Secondary | ICD-10-CM | POA: Diagnosis not present

## 2021-09-05 NOTE — Progress Notes (Signed)
?Virtual Visit via Telephone Note ? ?I connected with  Jasmine [name] on 09/05/21 at  9:30 AM EDT by telephone and verified that I am speaking with the correct person using two identifiers. ? ?Location: ?Patient: home ?Provider: [facility] ?Persons participating in the virtual visit: patient/Nurse Health Advisor ?  ?I discussed the limitations, risks, security and privacy concerns of performing an evaluation and management service by telephone and the availability of in person appointments. The patient expressed understanding and agreed to proceed. ? ?Interactive audio and video telecommunications were attempted between this nurse and patient, however failed, due to patient having technical difficulties OR patient did not have access to video capability.  We continued and completed visit with audio only. ? ?Some vital signs may be absent or patient reported.  ? ?Jasmine David, LPN ? ?Subjective:  ? Jasmine [name] is a 71 y.o. female who presents for Medicare Annual (Subsequent) preventive examination. ? ?Review of Systems    ? ?  ? ?   ?Objective:  ?  ?There were no vitals filed for this visit. ?There is no height or weight on file to calculate BMI. ? ? ?  07/04/2020  ?  3:35 PM 08/03/2019  ?  4:17 PM 07/17/2019  ?  4:00 PM 07/04/2018  ?  6:32 PM  ?Advanced Directives  ?Does Patient Have a Medical Advance Directive? Yes No No No  ?Type of Paramedic of [place];Living will     ?Copy of [place] in Chart? No - copy requested     ?Would patient like information on creating a medical advance directive?  No - Patient declined No - Patient declined No - Patient declined  ? ? ?Current Medications (verified) ?Outpatient Encounter Medications as of 09/05/2021  ?Medication Sig  ? amLODipine (NORVASC) 10 MG tablet TAKE 1 TABLET(10 MG) BY MOUTH DAILY  ? aspirin 81 MG EC tablet Take 1 tablet (81 mg total) by mouth daily.  ? atorvastatin (LIPITOR) 80 MG tablet TAKE 1 TABLET(80 MG) BY  MOUTH DAILY AT 6 PM  ? cholecalciferol (VITAMIN D3) 25 MCG (1000 UNIT) tablet Take 1,000 Units by mouth daily.  ? hydrALAZINE (APRESOLINE) 50 MG tablet 1 pill 3 times a day  ? losartan (COZAAR) 50 MG tablet Take 1 tablet (50 mg total) by mouth daily.  ? Multiple Vitamins-Minerals (EMERGEN-C IMMUNE PLUS/VIT D PO) Take 1 tablet by mouth daily at 6 (six) AM. With zinc  ? Probiotic Product (PROBIOTIC-10 ULTIMATE) CAPS Take 1 capsule by mouth daily.  ? ?No facility-administered encounter medications on file as of 09/05/2021.  ? ? ?Allergies (verified) ?Patient has no known allergies.  ? ?History: ?Past Medical History:  ?Diagnosis Date  ? Basal ganglia stroke ([place])   ? Hyperlipidemia   ? Hypertension   ? Hypertensive emergency   ? Left wrist fracture   ? ?Past Surgical History:  ?Procedure Laterality Date  ? TUBAL LIGATION  1981  ? ?Family History  ?Problem Relation Age of Onset  ? Cancer Mother   ? Cancer Father   ? Cancer Sister   ?     throat cancer  ? ?Social History  ? ?Socioeconomic History  ? Marital status: Married  ?  Spouse name: Not on file  ? Number of children: 2  ? Years of education: Not on file  ? Highest education level: GED or equivalent  ?Occupational History  ? Occupation: homemaker  ?Tobacco Use  ? Smoking status: Every Day  ?  Packs/day: 0.75  ?  Years: 55.00  ?  Pack years: 41.25  ?  Types: Cigarettes  ? Smokeless tobacco: Never  ?Vaping Use  ? Vaping Use: Never used  ?Substance and Sexual Activity  ? Alcohol use: Not Currently  ? Drug use: Not Currently  ? Sexual activity: Not Currently  ?Other Topics Concern  ? Not on file  ?Social History Narrative  ? Not on file  ? ?Social Determinants of Health  ? ?Financial Resource Strain: Not on file  ?Food Insecurity: Not on file  ?Transportation Needs: Not on file  ?Physical Activity: Not on file  ?Stress: Not on file  ?Social Connections: Not on file  ? ? ?Tobacco Counseling ?Ready to quit: Not Answered ?Counseling given: Not Answered ? ? ?Clinical  Intake: ? ?Pre-visit preparation completed: Yes ? ?Pain : No/denies pain ? ?  ? ?Nutritional Risks: None ?Diabetes: No ? ?How often do you need to have someone help you when you read instructions, pamphlets, or other written materials from your doctor or pharmacy?: 1 - Never ? ?Diabetic?no ? ?Interpreter Needed?: No ? ?Information entered by :: Kennedy Bucker, LPN ? ? ?Activities of Daily Living ? ?  09/04/2021  ?  8:56 PM  ?In your present state of health, do you have any difficulty performing the following activities:  ?Hearing? 0  ?Vision? 0  ?Difficulty concentrating or making decisions? 0  ?Walking or climbing stairs? 0  ?Dressing or bathing? 0  ?Doing errands, shopping? 0  ?Preparing Food and eating ? N  ?Using the Toilet? N  ?In the past six months, have you accidently leaked urine? N  ?Do you have problems with loss of bowel control? N  ?Managing your Medications? N  ?Managing your Finances? N  ?Housekeeping or managing your Housekeeping? N  ? ? ?Patient Care Team: ?Erasmo Downer, MD as PCP - General (Family Medicine) ?Antonieta Iba, MD as Consulting Physician (Cardiology) ?Pa, [facility]) ? ?Indicate any recent Medical Services you may have received from other than [facility] providers in the past year (date may be approximate). ? ?   ?Assessment:  ? This is a routine wellness examination for Jasmine [name]. ? ?Hearing/Vision screen ?No results found. ? ?Dietary issues and exercise activities discussed: ?  ? ? Goals Addressed   ?None ?  ? ?Depression Screen ? ?  07/04/2020  ?  3:33 PM 08/05/2019  ?  9:58 AM  ?PHQ 2/9 Scores  ?PHQ - 2 Score 0 2  ?PHQ- 9 Score  11  ?  ?Fall Risk ? ?  09/04/2021  ?  8:56 PM 07/04/2020  ?  3:35 PM 08/05/2019  ? 10:01 AM  ?Fall Risk   ?Falls in the past year? 0 0 0  ?Number falls in past yr: 0 0 0  ?Injury with Fall? 0 0 0  ?Follow up   Falls evaluation completed  ? ? ?FALL RISK PREVENTION PERTAINING TO THE HOME: ? ?Any stairs in or around the home? Yes  ?If so, are there  any without handrails? No  ?Home free of loose throw rugs in walkways, pet beds, electrical cords, etc? Yes  ?Adequate lighting in your home to reduce risk of falls? Yes  ? ?ASSISTIVE DEVICES UTILIZED TO PREVENT FALLS: ? ?Life alert? No  ?Use of a cane, walker or w/c? No  ?Grab bars in the bathroom? Yes  ?Shower chair or bench in shower? Yes  ?Elevated toilet seat or a handicapped toilet? No  ? ?Cognitive  Function: ?  ?  ?  ? ?Immunizations ?Immunization History  ?Administered Date(s) Administered  ? Influenza, High Dose Seasonal PF 01/06/2018  ? Td 02/02/2011  ? Tdap 02/02/2011  ? ? ?TDAP status: Due, Education has been provided regarding the importance of this vaccine. Advised may receive this vaccine at local pharmacy or Health Dept. Aware to provide a copy of the vaccination record if obtained from local pharmacy or Health Dept. Verbalized acceptance and understanding. ? ?Flu Vaccine status: Declined, Education has been provided regarding the importance of this vaccine but patient still declined. Advised may receive this vaccine at local pharmacy or Health Dept. Aware to provide a copy of the vaccination record if obtained from local pharmacy or Health Dept. Verbalized acceptance and understanding. ? ?Pneumococcal vaccine status: Declined,  Education has been provided regarding the importance of this vaccine but patient still declined. Advised may receive this vaccine at local pharmacy or Health Dept. Aware to provide a copy of the vaccination record if obtained from local pharmacy or Health Dept. Verbalized acceptance and understanding.  ? ?Covid-19 vaccine status: Declined, Education has been provided regarding the importance of this vaccine but patient still declined. Advised may receive this vaccine at local pharmacy or Health Dept.or vaccine clinic. Aware to provide a copy of the vaccination record if obtained from local pharmacy or Health Dept. Verbalized acceptance and understanding. ? ?Qualifies for  Shingles Vaccine? Yes   ?Zostavax completed No   ?Shingrix Completed?: No.    Education has been provided regarding the importance of this vaccine. Patient has been advised to call insurance company to determine out

## 2021-09-05 NOTE — Patient Instructions (Signed)
Jasmine [name] , ?Thank you for taking time to come for your Medicare Wellness Visit. I appreciate your ongoing commitment to your health goals. Please review the following plan we discussed and let me know if I can assist you in the future.  ? ?Screening recommendations/referrals: ?Colonoscopy: declined referral ?Mammogram: declined referral ?Bone Density: declined referral ?Recommended yearly ophthalmology/optometry visit for glaucoma screening and checkup ?Recommended yearly dental visit for hygiene and checkup ? ?Vaccinations: ?Influenza vaccine: n/c ?Pneumococcal vaccine: n/d ?Tdap vaccine: 02/02/11, due ?Shingles vaccine: n/d   ?Covid-19:n/d ? ?Advanced directives: no ? ?Conditions/risks identified: none ? ?Next appointment: Follow up in one year for your annual wellness visit 09/10/22 @ 9:30am by phone ? ? ?Preventive Care 20 Years and Older, Female ?Preventive care refers to lifestyle choices and visits with your health care provider that can promote health and wellness. ?What does preventive care include? ?A yearly physical exam. This is also called an annual well check. ?Dental exams once or twice a year. ?Routine eye exams. Ask your health care provider how often you should have your eyes checked. ?Personal lifestyle choices, including: ?Daily care of your teeth and gums. ?Regular physical activity. ?Eating a healthy diet. ?Avoiding tobacco and drug use. ?Limiting alcohol use. ?Practicing safe sex. ?Taking low-dose aspirin every day. ?Taking vitamin and mineral supplements as recommended by your health care provider. ?What happens during an annual well check? ?The services and screenings done by your health care provider during your annual well check will depend on your age, overall health, lifestyle risk factors, and family history of disease. ?Counseling  ?Your health care provider may ask you questions about your: ?Alcohol use. ?Tobacco use. ?Drug use. ?Emotional well-being. ?Home and relationship  well-being. ?Sexual activity. ?Eating habits. ?History of falls. ?Memory and ability to understand (cognition). ?Work and work Statistician. ?Reproductive health. ?Screening  ?You may have the following tests or measurements: ?Height, weight, and BMI. ?Blood pressure. ?Lipid and cholesterol levels. These may be checked every 5 years, or more frequently if you are over 65 years old. ?Skin check. ?Lung cancer screening. You may have this screening every year starting at age 61 if you have a 30-pack-year history of smoking and currently smoke or have quit within the past 15 years. ?Fecal occult blood test (FOBT) of the stool. You may have this test every year starting at age 21. ?Flexible sigmoidoscopy or colonoscopy. You may have a sigmoidoscopy every 5 years or a colonoscopy every 10 years starting at age 71. ?Hepatitis C blood test. ?Hepatitis B blood test. ?Sexually transmitted disease (STD) testing. ?Diabetes screening. This is done by checking your blood sugar (glucose) after you have not eaten for a while (fasting). You may have this done every 1-3 years. ?Bone density scan. This is done to screen for osteoporosis. You may have this done starting at age 21. ?Mammogram. This may be done every 1-2 years. Talk to your health care provider about how often you should have regular mammograms. ?Talk with your health care provider about your test results, treatment options, and if necessary, the need for more tests. ?Vaccines  ?Your health care provider may recommend certain vaccines, such as: ?Influenza vaccine. This is recommended every year. ?Tetanus, diphtheria, and acellular pertussis (Tdap, Td) vaccine. You may need a Td booster every 10 years. ?Zoster vaccine. You may need this after age 16. ?Pneumococcal 13-valent conjugate (PCV13) vaccine. One dose is recommended after age 53. ?Pneumococcal polysaccharide (PPSV23) vaccine. One dose is recommended after age 67. ?Talk to your health  care provider about which  screenings and vaccines you need and how often you need them. ?This information is not intended to replace advice given to you by your health care provider. Make sure you discuss any questions you have with your health care provider. ?Document Released: 05/20/2015 Document Revised: 01/11/2016 Document Reviewed: 02/22/2015 ?Elsevier Interactive Patient Education ? 2017 [place]. ? ?Fall Prevention in the Home ?Falls can cause injuries. They can happen to people of all ages. There are many things you can do to make your home safe and to help prevent falls. ?What can I do on the outside of my home? ?Regularly fix the edges of walkways and driveways and fix any cracks. ?Remove anything that might make you trip as you walk through a door, such as a raised step or threshold. ?Trim any bushes or trees on the path to your home. ?Use bright outdoor lighting. ?Clear any walking paths of anything that might make someone trip, such as rocks or tools. ?Regularly check to see if handrails are loose or broken. Make sure that both sides of any steps have handrails. ?Any raised decks and porches should have guardrails on the edges. ?Have any leaves, snow, or ice cleared regularly. ?Use sand or salt on walking paths during winter. ?Clean up any spills in your garage right away. This includes oil or grease spills. ?What can I do in the bathroom? ?Use night lights. ?Install grab bars by the toilet and in the tub and shower. Do not use towel bars as grab bars. ?Use non-skid mats or decals in the tub or shower. ?If you need to sit down in the shower, use a plastic, non-slip stool. ?Keep the floor dry. Clean up any water that spills on the floor as soon as it happens. ?Remove soap buildup in the tub or shower regularly. ?Attach bath mats securely with double-sided non-slip rug tape. ?Do not have throw rugs and other things on the floor that can make you trip. ?What can I do in the bedroom? ?Use night lights. ?Make sure that you have a  light by your bed that is easy to reach. ?Do not use any sheets or blankets that are too big for your bed. They should not hang down onto the floor. ?Have a firm chair that has side arms. You can use this for support while you get dressed. ?Do not have throw rugs and other things on the floor that can make you trip. ?What can I do in the kitchen? ?Clean up any spills right away. ?Avoid walking on wet floors. ?Keep items that you use a lot in easy-to-reach places. ?If you need to reach something above you, use a strong step stool that has a grab bar. ?Keep electrical cords out of the way. ?Do not use floor polish or wax that makes floors slippery. If you must use wax, use non-skid floor wax. ?Do not have throw rugs and other things on the floor that can make you trip. ?What can I do with my stairs? ?Do not leave any items on the stairs. ?Make sure that there are handrails on both sides of the stairs and use them. Fix handrails that are broken or loose. Make sure that handrails are as long as the stairways. ?Check any carpeting to make sure that it is firmly attached to the stairs. Fix any carpet that is loose or worn. ?Avoid having throw rugs at the top or bottom of the stairs. If you do have throw rugs, attach them to  the floor with carpet tape. ?Make sure that you have a light switch at the top of the stairs and the bottom of the stairs. If you do not have them, ask someone to add them for you. ?What else can I do to help prevent falls? ?Wear shoes that: ?Do not have high heels. ?Have rubber bottoms. ?Are comfortable and fit you well. ?Are closed at the toe. Do not wear sandals. ?If you use a stepladder: ?Make sure that it is fully opened. Do not climb a closed stepladder. ?Make sure that both sides of the stepladder are locked into place. ?Ask someone to hold it for you, if possible. ?Clearly mark and make sure that you can see: ?Any grab bars or handrails. ?First and last steps. ?Where the edge of each step  is. ?Use tools that help you move around (mobility aids) if they are needed. These include: ?Canes. ?Walkers. ?Scooters. ?Crutches. ?Turn on the lights when you go into a dark area. Replace any light bulbs as soon as

## 2022-04-18 ENCOUNTER — Other Ambulatory Visit: Payer: Self-pay

## 2022-04-18 MED ORDER — LOSARTAN POTASSIUM 50 MG PO TABS: 50.0000 mg | ORAL_TABLET | Freq: Every day | ORAL | 3 refills | Status: DC

## 2022-04-18 NOTE — Telephone Encounter (Signed)
Refill to pharmacy 

## 2022-04-23 ENCOUNTER — Other Ambulatory Visit: Payer: Self-pay | Admitting: Family Medicine

## 2022-04-23 ENCOUNTER — Telehealth: Payer: Self-pay | Admitting: Cardiovascular Disease

## 2022-04-23 MED ORDER — ATORVASTATIN CALCIUM 80 MG PO TABS: ORAL_TABLET | ORAL | 1 refills | Status: DC

## 2022-04-23 MED ORDER — AMLODIPINE BESYLATE 10 MG PO TABS: ORAL_TABLET | ORAL | 1 refills | Status: DC

## 2022-04-23 MED ORDER — HYDRALAZINE HCL 50 MG PO TABS: ORAL_TABLET | ORAL | 1 refills | Status: DC

## 2022-04-23 NOTE — Telephone Encounter (Signed)
Pt c/o medication issue:  1. Name of Medication: amLODipine (NORVASC) 10 MG tablet; atorvastatin (LIPITOR) 80 MG tablet; hydrALAZINE (APRESOLINE) 50 MG tablet   2. How are you currently taking this medication (dosage and times per day)? As written   3. Are you having a reaction (difficulty breathing--STAT)? No   4. What is your medication issue? [id number] - [place], [place] - [address] denied all 3 medications of patient. Patient mentioned that she really needs her medication

## 2022-04-23 NOTE — Telephone Encounter (Signed)
The patient is overdue for follow up- last seen 02/2021 with Dr. Mariah Milling and advised to come back in 1 year.  She currently has an appointment scheduled for 06/04/22 with Dr. Mariah Milling.   Refills sent for the requested medications for a 30-day supply w/ 1 refill.  I have notified the patient of the above. She voices understanding and is agreeable.

## 2022-04-23 NOTE — Telephone Encounter (Signed)
Pts meds were last refilled by Dr. Mariah Milling / please advise if these should be refilled by Dr. Beryle Flock    amLODipine (NORVASC) 10 MG tablet [[id number]]   atorvastatin (LIPITOR) 80 MG tablet [[id number]]   hydrALAZINE (APRESOLINE) 50 MG tablet [[id number]]    If so please advise and send refills to pharmacy [id number] - [place], [place] - [address]

## 2022-06-02 NOTE — Progress Notes (Unsigned)
Cardiology Office Note  Date:  06/04/2022   ID:  Jasmine [name], DOB November 07, 1950, MRN [id number]  PCP:  Virginia Crews, MD   Chief Complaint  Patient presents with   12 month follow up    "Doing well." Medications reviewed by the patient verbally.     HPI:  Jasmine [name] is a 72 year old woman with past medical history of Chronic Hypertensive urgency Right basal ganglia infarct Smoker <1/2 ppd Who presents for routine follow-up for hypertension, history of stroke, smoker  Last seen in clinic October 2022 Still smoking, "cut down", 1/2 pack/day  No new medical issues since prior clinical visit Reports blood pressure typically well-controlled on current medication regiment, compliant with her 3 blood pressure pills  Has had her RSV and flu vaccine  No leg swelling, no PND orthopnea, no chest pain or shortness of breath on exertion  EKG personally reviewed by myself on todays visit Normal sinus rhythm rate 50 bpm no significant ST-T wave changes  Other past medical history reviewed hospital March 2021 with hypertensive emergency left-sided weakness dysarthria when getting out of bed Blood pressure 230/108 MRI acute right basal ganglia infarct Neurology felt  consistent with hypertensive encephalopathy  started on cleviprex along with amlodipine and HCTZ.  Overnight 3/14, cleviprex was weaned off   instructed by neurology to take aspirin Plavix for 3 weeks then stop Plavix and continue aspirin indefinitely  Echocardiogram July 18, 2019 Ejection fraction 60 to 65%, moderate LVH Grade 1 diastolic dysfunction  CT scan Atherosclerotic plaque  affecting both common carotid arteries without measurable stenosis.  Soft and calcified plaque at both carotid bifurcations and ICA bulbs but without measurable stenosis. No posterior circulation pathology. Emphysema.   PMH:   has a past medical history of Basal ganglia stroke ([place]), Hyperlipidemia, Hypertension,  Hypertensive emergency, and Left wrist fracture.  PSH:    Past Surgical History:  Procedure Laterality Date   TUBAL LIGATION  1981    Current Outpatient Medications  Medication Sig Dispense Refill   amLODipine (NORVASC) 10 MG tablet TAKE 1 TABLET(10 MG) BY MOUTH DAILY. Must keep upcoming office visit for any further refills. 30 tablet 1   aspirin 81 MG EC tablet Take 1 tablet (81 mg total) by mouth daily. 90 tablet 1   cholecalciferol (VITAMIN D3) 25 MCG (1000 UNIT) tablet Take 1,000 Units by mouth daily.     hydrALAZINE (APRESOLINE) 50 MG tablet Take 1 tablet (50 mg) by mouth three times a day. Must keep upcoming office visit for any further refills. 90 tablet 1   losartan (COZAAR) 50 MG tablet Take 1 tablet (50 mg total) by mouth daily. 90 tablet 3   Probiotic Product (PROBIOTIC-10 ULTIMATE) CAPS Take 1 capsule by mouth daily.     atorvastatin (LIPITOR) 80 MG tablet TAKE 1 TABLET(80 MG) BY MOUTH DAILY AT 6 PM. Must keep upcoming office visit for any further refills. 90 tablet 2   No current facility-administered medications for this visit.    Allergies:   Patient has no known allergies.   Social History:  The patient  reports that she has been smoking cigarettes. She has a 41.25 pack-year smoking history. She has never used smokeless tobacco. She reports that she does not currently use alcohol. She reports that she does not currently use drugs.   Family History:   family history includes Cancer in her father, mother, and sister.    Review of Systems: Review of Systems  Constitutional: Negative.   HENT: Negative.  Respiratory: Negative.    Cardiovascular: Negative.   Gastrointestinal: Negative.   Musculoskeletal: Negative.   Neurological: Negative.   Psychiatric/Behavioral: Negative.    All other systems reviewed and are negative.   PHYSICAL EXAM: VS:  BP 124/68 (BP Location: Left Arm, Patient Position: Sitting, Cuff Size: Normal)   Pulse (!) 50   Ht 5' (1.524 m)   Wt  84 lb (38.1 kg)   SpO2 98%   BMI 16.41 kg/m  , BMI Body mass index is 16.41 kg/m. Constitutional:  oriented to person, place, and time. No distress.  HENT:  Head: Grossly normal Eyes:  no discharge. No scleral icterus.  Neck: No JVD, no carotid bruits  Cardiovascular: Regular rate and rhythm, no murmurs appreciated Pulmonary/Chest: Clear to auscultation bilaterally, no wheezes or rails Abdominal: Soft.  no distension.  no tenderness.  Musculoskeletal: Normal range of motion Neurological:  normal muscle tone. Coordination normal. No atrophy Skin: Skin warm and dry Psychiatric: normal affect, pleasant   Recent Labs: No results found for requested labs within last 365 days.    Lipid Panel Lab Results  Component Value Date   CHOL 97 (L) 01/10/2021   HDL 47 01/10/2021   LDLCALC 39 01/10/2021   TRIG 39 01/10/2021      Wt Readings from Last 3 Encounters:  06/04/22 84 lb (38.1 kg)  09/05/21 84 lb (38.1 kg)  02/07/21 84 lb (38.1 kg)     ASSESSMENT AND PLAN:  Problem List Items Addressed This Visit       Cardiology Problems   Essential hypertension - Primary   Relevant Medications   atorvastatin (LIPITOR) 80 MG tablet   Other Relevant Orders   EKG 36-OQHU   Basic Metabolic Panel (BMET)   Hyperlipidemia   Relevant Medications   atorvastatin (LIPITOR) 80 MG tablet   Other Relevant Orders   EKG 12-Lead   Lipid panel     Other   History of CVA (cerebrovascular accident)   Relevant Orders   EKG 76-LYYT   Basic Metabolic Panel (BMET)   Other Visit Diagnoses     Smoker         Hypertension/stroke Prior stroke 2021 in the setting of poorly controlled hypertension Moderate LVH on echocardiogram consistent with chronic hypertension HCTZ previously held for hyponatremia Tolerating amlodipine 10, hydralazine 50 mg 3 times a day, losartan 50 daily Refills provided We have ordered BMP  Smoking We have encouraged her to continue to work on weaning her cigarettes  and smoking cessation. She will continue to work on this and does not want any assistance with chantix.  Reports she is down from 2 packs a day now down to less than 1/2 pack/day  Hyperlipidemia On statin, cholesterol at goal in 2022 to Fasting lipid panel ordered    Total encounter time more than 30 minutes  Greater than 50% was spent in counseling and coordination of care with the patient    Signed, Esmond Plants, M.D., Ph.D. [place], [place]

## 2022-06-04 ENCOUNTER — Ambulatory Visit: Payer: Medicare Other | Attending: Cardiovascular Disease | Admitting: Cardiovascular Disease

## 2022-06-04 ENCOUNTER — Encounter: Payer: Self-pay | Admitting: Cardiovascular Disease

## 2022-06-04 VITALS — BP 124/68 | HR 50 | Ht 60.0 in | Wt 84.0 lb

## 2022-06-04 DIAGNOSIS — Z8673 Personal history of transient ischemic attack (TIA), and cerebral infarction without residual deficits: Secondary | ICD-10-CM | POA: Diagnosis not present

## 2022-06-04 DIAGNOSIS — F17200 Nicotine dependence, unspecified, uncomplicated: Secondary | ICD-10-CM

## 2022-06-04 DIAGNOSIS — I10 Essential (primary) hypertension: Secondary | ICD-10-CM

## 2022-06-04 DIAGNOSIS — E782 Mixed hyperlipidemia: Secondary | ICD-10-CM

## 2022-06-04 MED ORDER — LOSARTAN POTASSIUM 50 MG PO TABS: 50.0000 mg | ORAL_TABLET | Freq: Every day | ORAL | 3 refills | Status: DC

## 2022-06-04 MED ORDER — ATORVASTATIN CALCIUM 80 MG PO TABS: ORAL_TABLET | ORAL | 2 refills | Status: DC

## 2022-06-04 MED ORDER — AMLODIPINE BESYLATE 10 MG PO TABS: ORAL_TABLET | ORAL | 3 refills | Status: DC

## 2022-06-04 MED ORDER — HYDRALAZINE HCL 50 MG PO TABS: ORAL_TABLET | ORAL | 3 refills | Status: DC

## 2022-06-04 NOTE — Patient Instructions (Addendum)
Medication Instructions:  No changes  90 day refills  If you need a refill on your cardiac medications before your next appointment, please call your pharmacy.   Lab work: BMP, LIPIDS at [place]  Testing/Procedures: No new testing needed  Follow-Up: At [facility], you and your health needs are our priority.  As part of our continuing mission to provide you with exceptional heart care, we have created designated Provider Care Teams.  These Care Teams include your primary Cardiologist (physician) and Advanced Practice Providers (APPs -  Physician Assistants and Nurse Practitioners) who all work together to provide you with the care you need, when you need it.  You will need a follow up appointment in 12 months  Providers on your designated Care Team:   Murray Hodgkins, NP Christell Faith, PA-C Cadence Kathlen Mody, [place]  COVID-19 Vaccine Information can be found at: [email or web address] For questions related to vaccine distribution or appointments, please email [email or web address] or call [phone].

## 2022-06-04 NOTE — Addendum Note (Signed)
Addended by: Minna Merritts on: 06/04/2022 02:43 PM   Modules accepted: Orders

## 2022-06-05 DIAGNOSIS — I10 Essential (primary) hypertension: Secondary | ICD-10-CM | POA: Diagnosis not present

## 2022-06-05 DIAGNOSIS — E782 Mixed hyperlipidemia: Secondary | ICD-10-CM | POA: Diagnosis not present

## 2022-06-05 DIAGNOSIS — Z8673 Personal history of transient ischemic attack (TIA), and cerebral infarction without residual deficits: Secondary | ICD-10-CM | POA: Diagnosis not present

## 2022-06-06 LAB — BASIC METABOLIC PANEL
BUN/Creatinine Ratio: 10 — ABNORMAL LOW (ref 12–28)
BUN: 6 mg/dL — ABNORMAL LOW (ref 8–27)
CO2: 21 mmol/L (ref 20–29)
Calcium: 10 mg/dL (ref 8.7–10.3)
Chloride: 104 mmol/L (ref 96–106)
Creatinine, Ser: 0.61 mg/dL (ref 0.57–1.00)
Glucose: 87 mg/dL (ref 70–99)
Potassium: 4.2 mmol/L (ref 3.5–5.2)
Sodium: 140 mmol/L (ref 134–144)
eGFR: 96 mL/min/{1.73_m2} (ref >59)

## 2022-06-06 LAB — LIPID PANEL
Chol/HDL Ratio: 2 ratio (ref 0.0–4.4)
Cholesterol, Total: 126 mg/dL (ref 100–199)
HDL: 64 mg/dL (ref >39)
LDL Chol Calc (NIH): 48 mg/dL (ref 0–99)
Triglycerides: 66 mg/dL (ref 0–149)
VLDL Cholesterol Cal: 14 mg/dL (ref 5–40)

## 2022-09-10 ENCOUNTER — Ambulatory Visit (INDEPENDENT_AMBULATORY_CARE_PROVIDER_SITE_OTHER): Payer: Medicare Other

## 2022-09-10 VITALS — Ht 60.0 in | Wt 84.0 lb

## 2022-09-10 DIAGNOSIS — Z0000 Encounter for general adult medical examination without abnormal findings: Secondary | ICD-10-CM

## 2022-09-10 NOTE — Progress Notes (Signed)
I connected with  Jasmine [name] on 09/10/22 by a audio enabled telemedicine application and verified that I am speaking with the correct person using two identifiers.  Patient Location: Home  Provider Location: Office/Clinic  I discussed the limitations of evaluation and management by telemedicine. The patient expressed understanding and agreed to proceed.  Subjective:   Jasmine [name] is a 72 y.o. female who presents for Medicare Annual (Subsequent) preventive examination.  Review of Systems     Cardiac Risk Factors include: advanced age (>75men, >29 women);dyslipidemia;hypertension;smoking/ tobacco exposure     Objective:    Today's Vitals   09/10/22 0926  Weight: 84 lb (38.1 kg)  Height: 5' (1.524 m)   Body mass index is 16.41 kg/m.     09/10/2022    9:33 AM 09/05/2021    9:31 AM 07/04/2020    3:35 PM 08/03/2019    4:17 PM 07/17/2019    4:00 PM 07/04/2018    6:32 PM  Advanced Directives  Does Patient Have a Medical Advance Directive? No No Yes No No No  Type of Surveyor, minerals;Living will     Copy of Healthcare Power of Attorney in Chart?   No - copy requested     Would patient like information on creating a medical advance directive?  No - Patient declined  No - Patient declined No - Patient declined No - Patient declined    Current Medications (verified) Outpatient Encounter Medications as of 09/10/2022  Medication Sig   amLODipine (NORVASC) 10 MG tablet TAKE 1 TABLET(10 MG) BY MOUTH DAILY. Must keep upcoming office visit for any further refills.   aspirin 81 MG EC tablet Take 1 tablet (81 mg total) by mouth daily.   atorvastatin (LIPITOR) 80 MG tablet TAKE 1 TABLET(80 MG) BY MOUTH DAILY AT 6 PM. Must keep upcoming office visit for any further refills.   hydrALAZINE (APRESOLINE) 50 MG tablet Take 1 tablet (50 mg) by mouth three times a day. Must keep upcoming office visit for any further refills.   losartan (COZAAR) 50 MG tablet  Take 1 tablet (50 mg total) by mouth daily.   Probiotic Product (PROBIOTIC-10 ULTIMATE) CAPS Take 1 capsule by mouth daily.   cholecalciferol (VITAMIN D3) 25 MCG (1000 UNIT) tablet Take 1,000 Units by mouth daily. (Patient not taking: Reported on 09/10/2022)   No facility-administered encounter medications on file as of 09/10/2022.    Allergies (verified) Patient has no known allergies.   History: Past Medical History:  Diagnosis Date   Basal ganglia stroke (HCC)    Hyperlipidemia    Hypertension    Hypertensive emergency    Left wrist fracture    Past Surgical History:  Procedure Laterality Date   TUBAL LIGATION  1981   Family History  Problem Relation Age of Onset   Cancer Mother    Cancer Father    Cancer Sister        throat cancer   Social History   Socioeconomic History   Marital status: Married    Spouse name: Not on file   Number of children: 2   Years of education: Not on file   Highest education level: GED or equivalent  Occupational History   Occupation: homemaker  Tobacco Use   Smoking status: Light Smoker    Packs/day: 0.50    Years: 55.00    Additional pack years: 0.00    Total pack years: 27.50    Types: Cigarettes   Smokeless  tobacco: Never  Vaping Use   Vaping Use: Never used  Substance and Sexual Activity   Alcohol use: Not Currently   Drug use: Not Currently   Sexual activity: Not Currently  Other Topics Concern   Not on file  Social History Narrative   Not on file   Social Determinants of Health   Financial Resource Strain: Low Risk  (09/09/2022)   Overall Financial Resource Strain (CARDIA)    Difficulty of Paying Living Expenses: Not hard at all  Food Insecurity: No Food Insecurity (09/09/2022)   Hunger Vital Sign    Worried About Running Out of Food in the Last Year: Never true    Ran Out of Food in the Last Year: Never true  Transportation Needs: No Transportation Needs (09/09/2022)   PRAPARE - Administrator, Civil Service  (Medical): No    Lack of Transportation (Non-Medical): No  Physical Activity: Insufficiently Active (09/05/2021)   Exercise Vital Sign    Days of Exercise per Week: 4 days    Minutes of Exercise per Session: 30 min  Stress: No Stress Concern Present (09/09/2022)   [employer] of Occupational Health - Occupational Stress Questionnaire    Feeling of Stress : Not at all  Social Connections: Unknown (09/09/2022)   Social Connection and Isolation Panel [NHANES]    Frequency of Communication with Friends and Family: Three times a week    Frequency of Social Gatherings with Friends and Family: More than three times a week    Attends Religious Services: Not on Marketing executive or Organizations: No    Attends Banker Meetings: Never    Marital Status: Married    Tobacco Counseling Ready to quit: No Counseling given: Not Answered   Clinical Intake:  Pre-visit preparation completed: Yes  Pain : No/denies pain     BMI - recorded: 16.41 Nutritional Status: BMI <19  Underweight Nutritional Risks: None Diabetes: No  How often do you need to have someone help you when you read instructions, pamphlets, or other written materials from your doctor or pharmacy?: 1 - Never  Diabetic?no  Interpreter Needed?: No  Comments: lives with husband Information entered by :: B.Brettney Ficken,LPN   Activities of Daily Living    09/09/2022    8:38 PM  In your present state of health, do you have any difficulty performing the following activities:  Hearing? 0  Vision? 0  Difficulty concentrating or making decisions? 0  Walking or climbing stairs? 0  Dressing or bathing? 0  Doing errands, shopping? 0  Preparing Food and eating ? N  Using the Toilet? N  In the past six months, have you accidently leaked urine? N  Do you have problems with loss of bowel control? N  Managing your Medications? N  Managing your Finances? N  Housekeeping or managing your Housekeeping? N     Patient Care Team: Erasmo Downer, MD as PCP - General (Family Medicine) Antonieta Iba, MD as Consulting Physician (Cardiology) Pa, [facility])  Indicate any recent Medical Services you may have received from other than [facility] providers in the past year (date may be approximate).     Assessment:   This is a routine wellness examination for Jessicalynn.  Hearing/Vision screen Hearing Screening - Comments:: Adequate hearing Vision Screening - Comments:: Adequate vision w/glasses [facility]  Dietary issues and exercise activities discussed: Current Exercise Habits: Home exercise routine, Type of exercise: walking, Time (Minutes): 20,  Frequency (Times/Week): 4, Weekly Exercise (Minutes/Week): 80, Intensity: Mild, Exercise limited by: None identified   Goals Addressed             This Visit's Progress    DIET - EAT MORE FRUITS AND VEGETABLES   On track    Quit Smoking   Not on track      Depression Screen    09/10/2022    9:30 AM 09/05/2021    9:30 AM 07/04/2020    3:33 PM 08/05/2019    9:58 AM  PHQ 2/9 Scores  PHQ - 2 Score 0 0 0 2  PHQ- 9 Score    11    Fall Risk    09/09/2022    8:38 PM 09/05/2021    9:32 AM 09/04/2021    8:56 PM 07/04/2020    3:35 PM 08/05/2019   10:01 AM  Fall Risk   Falls in the past year? 0 0 0 0 0  Number falls in past yr: 0 0 0 0 0  Injury with Fall? 0 0 0 0 0  Risk for fall due to :  No Fall Risks     Follow up  Falls evaluation completed   Falls evaluation completed    FALL RISK PREVENTION PERTAINING TO THE HOME:  Any stairs in or around the home? Yes  If so, are there any without handrails? Yes  Home free of loose throw rugs in walkways, pet beds, electrical cords, etc? Yes  Adequate lighting in your home to reduce risk of falls? Yes   ASSISTIVE DEVICES UTILIZED TO PREVENT FALLS:  Life alert? No  Use of a cane, walker or w/c? No  Grab bars in the bathroom? No  Shower chair or bench in shower? No  Elevated  toilet seat or a handicapped toilet? No    Cognitive Function:        09/10/2022    9:35 AM 09/05/2021    9:34 AM  6CIT Screen  What Year? 0 points 0 points  What month? 0 points 0 points  What time? 0 points 0 points  Count back from 20 0 points 0 points  Months in reverse 0 points 0 points  Repeat phrase 0 points 0 points  Total Score 0 points 0 points    Immunizations Immunization History  Administered Date(s) Administered   Influenza, High Dose Seasonal PF 01/06/2018   Td 02/02/2011   Tdap 02/02/2011    TDAP status: Up to date  Flu Vaccine status: Up to date  Pneumococcal vaccine status: Declined,  Education has been provided regarding the importance of this vaccine but patient still declined. Advised may receive this vaccine at local pharmacy or Health Dept. Aware to provide a copy of the vaccination record if obtained from local pharmacy or Health Dept. Verbalized acceptance and understanding.   Covid-19 vaccine status: Declined, Education has been provided regarding the importance of this vaccine but patient still declined. Advised may receive this vaccine at local pharmacy or Health Dept.or vaccine clinic. Aware to provide a copy of the vaccination record if obtained from local pharmacy or Health Dept. Verbalized acceptance and understanding.  Qualifies for Shingles Vaccine? Yes   Zostavax completed No   Shingrix Completed?: No.    Education has been provided regarding the importance of this vaccine. Patient has been advised to call insurance company to determine out of pocket expense if they have not yet received this vaccine. Advised may also receive vaccine at local pharmacy or Health Dept. Verbalized  acceptance and understanding.  Screening Tests Health Maintenance  Topic Date Due   COVID-19 Vaccine (1) Never done   Pneumonia Vaccine 23+ Years old (1 of 2 - PCV) Never done   Hepatitis C Screening  Never done   COLONOSCOPY (Pts 45-16yrs Insurance coverage will  need to be confirmed)  Never done   Lung Cancer Screening  Never done   MAMMOGRAM  Never done   Zoster Vaccines- Shingrix (1 of 2) Never done   DEXA SCAN  Never done   DTaP/Tdap/Td (3 - Td or Tdap) 02/01/2021   INFLUENZA VACCINE  12/06/2022   Medicare Annual Wellness (AWV)  09/10/2023   HPV VACCINES  Aged Out    Health Maintenance  Health Maintenance Due  Topic Date Due   COVID-19 Vaccine (1) Never done   Pneumonia Vaccine 43+ Years old (1 of 2 - PCV) Never done   Hepatitis C Screening  Never done   COLONOSCOPY (Pts 45-13yrs Insurance coverage will need to be confirmed)  Never done   Lung Cancer Screening  Never done   MAMMOGRAM  Never done   Zoster Vaccines- Shingrix (1 of 2) Never done   DEXA SCAN  Never done   DTaP/Tdap/Td (3 - Td or Tdap) 02/01/2021    Colorectal cancer screening: Type of screening: Colonoscopy. Completed no. Repeat every 5-10 years PT DECLINES  Mammogram status: Completed NO. Repeat every year Pt declines  Bone Density: Pt DECLINES  Lung Cancer Screening: (Low Dose CT Chest recommended if Age 49-80 years, 30 pack-year currently smoking OR have quit w/in 15years.) does not qualify.   Lung Cancer Screening Referral: no pt declines  Additional Screening:  Hepatitis C Screening: does not qualify; Completed no  Vision Screening: Recommended annual ophthalmology exams for early detection of glaucoma and other disorders of the eye. Is the patient up to date with their annual eye exam?  Yes  Who is the provider or what is the name of the office in which the patient attends annual eye exams? [facility] If pt is not established with a provider, would they like to be referred to a provider to establish care? No .   Dental Screening: Recommended annual dental exams for proper oral hygiene  Community Resource Referral / Chronic Care Management: CRR required this visit?  No   CCM required this visit?  No      Plan:     I have personally reviewed and  noted the following in the patient's chart:   Medical and social history Use of alcohol, tobacco or illicit drugs  Current medications and supplements including opioid prescriptions. Patient is not currently taking opioid prescriptions. Functional ability and status Nutritional status Physical activity Advanced directives List of other physicians Hospitalizations, surgeries, and ER visits in previous 12 months Vitals Screenings to include cognitive, depression, and falls Referrals and appointments  In addition, I have reviewed and discussed with patient certain preventive protocols, quality metrics, and best practice recommendations. A written personalized care plan for preventive services as well as general preventive health recommendations were provided to patient.     Sue Lush, LPN   0/01/8118   Nurse Notes: pt states she is doing good and has no questions or concerns at this time.

## 2022-09-10 NOTE — Patient Instructions (Signed)
Jasmine [name] , Thank you for taking time to come for your Medicare Wellness Visit. I appreciate your ongoing commitment to your health goals. Please review the following plan we discussed and let me know if I can assist you in the future.   These are the goals we discussed:  Goals      DIET - EAT MORE FRUITS AND VEGETABLES     Quit Smoking        This is a list of the screening recommended for you and due dates:  Health Maintenance  Topic Date Due   COVID-19 Vaccine (1) Never done   Pneumonia Vaccine (1 of 2 - PCV) Never done   Hepatitis C Screening: USPSTF Recommendation to screen - Ages 75-79 yo.  Never done   Colon Cancer Screening  Never done   Screening for Lung Cancer  Never done   Mammogram  Never done   Zoster (Shingles) Vaccine (1 of 2) Never done   DEXA scan (bone density measurement)  Never done   DTaP/Tdap/Td vaccine (3 - Td or Tdap) 02/01/2021   Flu Shot  12/06/2022   Medicare Annual Wellness Visit  09/10/2023   HPV Vaccine  Aged Out    Advanced directives: no  Conditions/risks identified: low falls risk  Next appointment: Follow up in one year for your annual wellness visit 09/11/2023 @9 :15am telephone   Preventive Care 65 Years and Older, Female Preventive care refers to lifestyle choices and visits with your health care provider that can promote health and wellness. What does preventive care include? A yearly physical exam. This is also called an annual well check. Dental exams once or twice a year. Routine eye exams. Ask your health care provider how often you should have your eyes checked. Personal lifestyle choices, including: Daily care of your teeth and gums. Regular physical activity. Eating a healthy diet. Avoiding tobacco and drug use. Limiting alcohol use. Practicing safe sex. Taking low-dose aspirin every day. Taking vitamin and mineral supplements as recommended by your health care provider. What happens during an annual well check? The  services and screenings done by your health care provider during your annual well check will depend on your age, overall health, lifestyle risk factors, and family history of disease. Counseling  Your health care provider may ask you questions about your: Alcohol use. Tobacco use. Drug use. Emotional well-being. Home and relationship well-being. Sexual activity. Eating habits. History of falls. Memory and ability to understand (cognition). Work and work Astronomer. Reproductive health. Screening  You may have the following tests or measurements: Height, weight, and BMI. Blood pressure. Lipid and cholesterol levels. These may be checked every 5 years, or more frequently if you are over 79 years old. Skin check. Lung cancer screening. You may have this screening every year starting at age 70 if you have a 30-pack-year history of smoking and currently smoke or have quit within the past 15 years. Fecal occult blood test (FOBT) of the stool. You may have this test every year starting at age 70. Flexible sigmoidoscopy or colonoscopy. You may have a sigmoidoscopy every 5 years or a colonoscopy every 10 years starting at age 50. Hepatitis C blood test. Hepatitis B blood test. Sexually transmitted disease (STD) testing. Diabetes screening. This is done by checking your blood sugar (glucose) after you have not eaten for a while (fasting). You may have this done every 1-3 years. Bone density scan. This is done to screen for osteoporosis. You may have this done starting at  age 51. Mammogram. This may be done every 1-2 years. Talk to your health care provider about how often you should have regular mammograms. Talk with your health care provider about your test results, treatment options, and if necessary, the need for more tests. Vaccines  Your health care provider may recommend certain vaccines, such as: Influenza vaccine. This is recommended every year. Tetanus, diphtheria, and acellular  pertussis (Tdap, Td) vaccine. You may need a Td booster every 10 years. Zoster vaccine. You may need this after age 53. Pneumococcal 13-valent conjugate (PCV13) vaccine. One dose is recommended after age 59. Pneumococcal polysaccharide (PPSV23) vaccine. One dose is recommended after age 31. Talk to your health care provider about which screenings and vaccines you need and how often you need them. This information is not intended to replace advice given to you by your health care provider. Make sure you discuss any questions you have with your health care provider. Document Released: 05/20/2015 Document Revised: 01/11/2016 Document Reviewed: 02/22/2015 Elsevier Interactive Patient Education  2017 [employer] Prevention in the Home Falls can cause injuries. They can happen to people of all ages. There are many things you can do to make your home safe and to help prevent falls. What can I do on the outside of my home? Regularly fix the edges of walkways and driveways and fix any cracks. Remove anything that might make you trip as you walk through a door, such as a raised step or threshold. Trim any bushes or trees on the path to your home. Use bright outdoor lighting. Clear any walking paths of anything that might make someone trip, such as rocks or tools. Regularly check to see if handrails are loose or broken. Make sure that both sides of any steps have handrails. Any raised decks and porches should have guardrails on the edges. Have any leaves, snow, or ice cleared regularly. Use sand or salt on walking paths during winter. Clean up any spills in your garage right away. This includes oil or grease spills. What can I do in the bathroom? Use night lights. Install grab bars by the toilet and in the tub and shower. Do not use towel bars as grab bars. Use non-skid mats or decals in the tub or shower. If you need to sit down in the shower, use a plastic, non-slip stool. Keep the floor  dry. Clean up any water that spills on the floor as soon as it happens. Remove soap buildup in the tub or shower regularly. Attach bath mats securely with double-sided non-slip rug tape. Do not have throw rugs and other things on the floor that can make you trip. What can I do in the bedroom? Use night lights. Make sure that you have a light by your bed that is easy to reach. Do not use any sheets or blankets that are too big for your bed. They should not hang down onto the floor. Have a firm chair that has side arms. You can use this for support while you get dressed. Do not have throw rugs and other things on the floor that can make you trip. What can I do in the kitchen? Clean up any spills right away. Avoid walking on wet floors. Keep items that you use a lot in easy-to-reach places. If you need to reach something above you, use a strong step stool that has a grab bar. Keep electrical cords out of the way. Do not use floor polish or wax that makes floors  slippery. If you must use wax, use non-skid floor wax. Do not have throw rugs and other things on the floor that can make you trip. What can I do with my stairs? Do not leave any items on the stairs. Make sure that there are handrails on both sides of the stairs and use them. Fix handrails that are broken or loose. Make sure that handrails are as long as the stairways. Check any carpeting to make sure that it is firmly attached to the stairs. Fix any carpet that is loose or worn. Avoid having throw rugs at the top or bottom of the stairs. If you do have throw rugs, attach them to the floor with carpet tape. Make sure that you have a light switch at the top of the stairs and the bottom of the stairs. If you do not have them, ask someone to add them for you. What else can I do to help prevent falls? Wear shoes that: Do not have high heels. Have rubber bottoms. Are comfortable and fit you well. Are closed at the toe. Do not wear  sandals. If you use a stepladder: Make sure that it is fully opened. Do not climb a closed stepladder. Make sure that both sides of the stepladder are locked into place. Ask someone to hold it for you, if possible. Clearly mark and make sure that you can see: Any grab bars or handrails. First and last steps. Where the edge of each step is. Use tools that help you move around (mobility aids) if they are needed. These include: Canes. Walkers. Scooters. Crutches. Turn on the lights when you go into a dark area. Replace any light bulbs as soon as they burn out. Set up your furniture so you have a clear path. Avoid moving your furniture around. If any of your floors are uneven, fix them. If there are any pets around you, be aware of where they are. Review your medicines with your doctor. Some medicines can make you feel dizzy. This can increase your chance of falling. Ask your doctor what other things that you can do to help prevent falls. This information is not intended to replace advice given to you by your health care provider. Make sure you discuss any questions you have with your health care provider. Document Released: 02/17/2009 Document Revised: 09/29/2015 Document Reviewed: 05/28/2014 Elsevier Interactive Patient Education  2017 [employer].

## 2022-10-07 ENCOUNTER — Other Ambulatory Visit: Payer: Self-pay | Admitting: Cardiovascular Disease

## 2022-10-08 ENCOUNTER — Other Ambulatory Visit: Payer: Self-pay

## 2022-10-08 MED ORDER — ATORVASTATIN CALCIUM 80 MG PO TABS: ORAL_TABLET | ORAL | 3 refills | Status: DC

## 2023-06-04 ENCOUNTER — Telehealth: Payer: Self-pay | Admitting: Cardiovascular Disease

## 2023-06-04 MED ORDER — HYDRALAZINE HCL 50 MG PO TABS: ORAL_TABLET | ORAL | 3 refills | Status: AC

## 2023-06-04 MED ORDER — ATORVASTATIN CALCIUM 80 MG PO TABS: ORAL_TABLET | ORAL | 3 refills | Status: AC

## 2023-06-04 MED ORDER — AMLODIPINE BESYLATE 10 MG PO TABS: ORAL_TABLET | ORAL | 3 refills | Status: AC

## 2023-06-04 MED ORDER — LOSARTAN POTASSIUM 50 MG PO TABS: 50.0000 mg | ORAL_TABLET | Freq: Every day | ORAL | 3 refills | Status: DC

## 2023-06-04 NOTE — Telephone Encounter (Signed)
*  STAT* If patient is at the pharmacy, call can be transferred to refill team.   1. Which medications need to be refilled? (please list name of each medication and dose if known)  hydrALAZINE (APRESOLINE) 50 MG tablet atorvastatin (LIPITOR) 80 MG tablet losartan (COZAAR) 50 MG tablet amLODipine (NORVASC) 10 MG tablet  2. Which pharmacy/location (including street and city if local pharmacy) is medication to be sent to? [id number] - [place], [place] - [address]  3. Do they need a 30 day or 90 day supply?  90 day supply

## 2023-06-04 NOTE — Telephone Encounter (Signed)
Requested Prescriptions   Signed Prescriptions Disp Refills   hydrALAZINE (APRESOLINE) 50 MG tablet 270 tablet 3    Sig: Take 1 tablet (50 mg) by mouth three times a day.    Authorizing Provider: Antonieta Iba    Ordering User: Iverson Alamin C   atorvastatin (LIPITOR) 80 MG tablet 90 tablet 3    Sig: TAKE 1 TABLET(80 MG) BY MOUTH DAILY AT 6 PM.    Authorizing Provider: Antonieta Iba    Ordering User: Shawnie Dapper, Dalana Pfahler C   amLODipine (NORVASC) 10 MG tablet 90 tablet 3    Sig: TAKE 1 TABLET(10 MG) BY MOUTH DAILY.    Authorizing Provider: Antonieta Iba    Ordering User: Iverson Alamin C   losartan (COZAAR) 50 MG tablet 90 tablet 3    Sig: Take 1 tablet (50 mg total) by mouth daily.    Authorizing Provider: Antonieta Iba    Ordering User: Kendrick Fries

## 2023-08-18 NOTE — Progress Notes (Unsigned)
 Cardiology Office Note  Date:  08/19/2023   ID:  Jasmine [name], DOB [birth year], MRN [id number]  PCP:  Mazie Speed, MD   Chief Complaint  Patient presents with   12 month follow up     "Doing well."     HPI:  [name] is a 73 year old woman with past medical history of Chronic Hypertensive urgency Right basal ganglia infarct Smoker 1 ppd Who presents for routine follow-up for hypertension, history of stroke, smoker  Last seen in clinic January 2024 In follow-up she reports that she continues to smoke 1 pack/day " Family around me causing me to smoke"  Has grandbabies, great grandbabies  Active, denies significant chest pain or shortness of breath concerning for angina No swelling, no PND No dizziness/orthostasis symptoms No recent illness this winter, did not get vaccinated  Reports she is tolerating her 3 blood pressure medications amlodipine 10 daily, hydralazine 50 3 times daily, losartan 50 daily  EKG personally reviewed by myself on todays visit EKG Interpretation Date/Time:  Monday August 19 2023 14:25:16 EDT Ventricular Rate:  61 PR Interval:  138 QRS Duration:  82 QT Interval:  424 QTC Calculation: 426 R Axis:   82  Text Interpretation: Normal sinus rhythm Normal ECG When compared with ECG of 17-Jul-2019 11:34, No significant change was found Confirmed by Belva Boyden [phone]) on 08/19/2023 2:32:34 PM    Other past medical history reviewed hospital March 2021 with hypertensive emergency left-sided weakness dysarthria when getting out of bed Blood pressure 230/108 MRI acute right basal ganglia infarct Neurology felt  consistent with hypertensive encephalopathy  started on cleviprex along with amlodipine and HCTZ.  Overnight 3/14, cleviprex was weaned off   instructed by neurology to take aspirin Plavix for 3 weeks then stop Plavix and continue aspirin indefinitely  Echocardiogram July 18, 2019 Ejection fraction 60 to 65%,  moderate LVH Grade 1 diastolic dysfunction  CT scan Atherosclerotic plaque  affecting both common carotid arteries without measurable stenosis.  Soft and calcified plaque at both carotid bifurcations and ICA bulbs but without measurable stenosis. No posterior circulation pathology. Emphysema.   PMH:   has a past medical history of Basal ganglia stroke (HCC), Hyperlipidemia, Hypertension, Hypertensive emergency, and Left wrist fracture.  PSH:    Past Surgical History:  Procedure Laterality Date   TUBAL LIGATION  1981    Current Outpatient Medications  Medication Sig Dispense Refill   amLODipine (NORVASC) 10 MG tablet TAKE 1 TABLET(10 MG) BY MOUTH DAILY. 90 tablet 3   aspirin 81 MG EC tablet Take 1 tablet (81 mg total) by mouth daily. 90 tablet 1   atorvastatin (LIPITOR) 80 MG tablet TAKE 1 TABLET(80 MG) BY MOUTH DAILY AT 6 PM. 90 tablet 3   hydrALAZINE (APRESOLINE) 50 MG tablet Take 1 tablet (50 mg) by mouth three times a day. 270 tablet 3   losartan (COZAAR) 50 MG tablet Take 1 tablet (50 mg total) by mouth daily. 90 tablet 3   Probiotic Product (PROBIOTIC-10 ULTIMATE) CAPS Take 1 capsule by mouth daily.     No current facility-administered medications for this visit.    Allergies:   Patient has no known allergies.   Social History:  The patient  reports that she has been smoking cigarettes. She has a 27.5 pack-year smoking history. She has never used smokeless tobacco. She reports that she does not currently use alcohol. She reports that she does not currently use drugs.   Family History:   family history includes  Cancer in her father, mother, and sister.    Review of Systems: Review of Systems  Constitutional: Negative.   HENT: Negative.    Respiratory: Negative.    Cardiovascular: Negative.   Gastrointestinal: Negative.   Musculoskeletal: Negative.   Neurological: Negative.   Psychiatric/Behavioral: Negative.    All other systems reviewed and are  negative.   PHYSICAL EXAM: VS:  BP 120/60 (BP Location: Left Arm, Patient Position: Sitting, Cuff Size: Normal)   Pulse 61   Ht 5' (1.524 m)   Wt 83 lb 6 oz (37.8 kg)   SpO2 96%   BMI 16.28 kg/m  , BMI Body mass index is 16.28 kg/m. Constitutional:  oriented to person, place, and time. No distress.  HENT:  Head: Grossly normal Eyes:  no discharge. No scleral icterus.  Neck: No JVD, no carotid bruits  Cardiovascular: Regular rate and rhythm, no murmurs appreciated Pulmonary/Chest: Clear to auscultation bilaterally, no wheezes or rales Abdominal: Soft.  no distension.  no tenderness.  Musculoskeletal: Normal range of motion Neurological:  normal muscle tone. Coordination normal. No atrophy Skin: Skin warm and dry Psychiatric: normal affect, pleasant  Recent Labs: No results found for requested labs within last 365 days.    Lipid Panel Lab Results  Component Value Date   CHOL 126 06/05/2022   HDL 64 06/05/2022   LDLCALC 48 06/05/2022   TRIG 66 06/05/2022      Wt Readings from Last 3 Encounters:  08/19/23 83 lb 6 oz (37.8 kg)  09/10/22 84 lb (38.1 kg)  06/04/22 84 lb (38.1 kg)     ASSESSMENT AND PLAN:  Problem List Items Addressed This Visit       Cardiology Problems   Essential hypertension - Primary   Relevant Orders   EKG 12-Lead (Completed)   Hyperlipidemia     Other   History of CVA (cerebrovascular accident)   Relevant Orders   EKG 12-Lead (Completed)   Other Visit Diagnoses       Smoker          Hypertension/stroke Prior stroke 2021 in the setting of poorly controlled hypertension Moderate LVH on echocardiogram consistent with chronic hypertension HCTZ previously held for hyponatremia Recommend she continue her amlodipine 10 daily hydralazine 50 mg 3 times daily losartan 50 daily Weight is stable, she denies orthostasis symptoms  Smoking Still smoking 1 pack/day We have encouraged her to continue to work on weaning her cigarettes and  smoking cessation. She will continue to work on this and does not want any assistance with chantix.    Hyperlipidemia Continue Lipitor 80 daily, cholesterol at goal in 1/24 Lipid panel ordered today with CMP    Signed, Juanda Noon, M.D., Ph.D. [facility] [place], [place] [phone]

## 2023-08-19 ENCOUNTER — Encounter: Payer: Self-pay | Admitting: Cardiovascular Disease

## 2023-08-19 ENCOUNTER — Ambulatory Visit: Payer: Medicare Other | Attending: Cardiovascular Disease | Admitting: Cardiovascular Disease

## 2023-08-19 VITALS — BP 120/60 | HR 61 | Ht 60.0 in | Wt 83.4 lb

## 2023-08-19 DIAGNOSIS — Z79899 Other long term (current) drug therapy: Secondary | ICD-10-CM | POA: Diagnosis not present

## 2023-08-19 DIAGNOSIS — Z8673 Personal history of transient ischemic attack (TIA), and cerebral infarction without residual deficits: Secondary | ICD-10-CM | POA: Diagnosis not present

## 2023-08-19 DIAGNOSIS — I10 Essential (primary) hypertension: Secondary | ICD-10-CM

## 2023-08-19 DIAGNOSIS — E782 Mixed hyperlipidemia: Secondary | ICD-10-CM | POA: Diagnosis not present

## 2023-08-19 DIAGNOSIS — F17200 Nicotine dependence, unspecified, uncomplicated: Secondary | ICD-10-CM | POA: Diagnosis not present

## 2023-08-19 NOTE — Patient Instructions (Addendum)
Medication Instructions:  No changes  If you need a refill on your cardiac medications before your next appointment, please call your pharmacy.   Lab work: Lipids and CMP  Testing/Procedures: No new testing needed  Follow-Up: At [facility], you and your health needs are our priority.  As part of our continuing mission to provide you with exceptional heart care, we have created designated Provider Care Teams.  These Care Teams include your primary Cardiologist (physician) and Advanced Practice Providers (APPs -  Physician Assistants and Nurse Practitioners) who all work together to provide you with the care you need, when you need it.  You will need a follow up appointment in 12 months  Providers on your designated Care Team:   Nicolasa Ducking, NP Eula Listen, PA-C Cadence Fransico Michael, [place]  COVID-19 Vaccine Information can be found at: [email or web address] For questions related to vaccine distribution or appointments, please email [email or web address] or call [phone].

## 2023-08-20 LAB — COMPREHENSIVE METABOLIC PANEL WITH GFR
ALT: 26 IU/L (ref 0–32)
AST: 28 IU/L (ref 0–40)
Albumin: 4 g/dL (ref 3.8–4.8)
Alkaline Phosphatase: 133 IU/L — ABNORMAL HIGH (ref 44–121)
BUN/Creatinine Ratio: 11 — ABNORMAL LOW (ref 12–28)
BUN: 7 mg/dL — ABNORMAL LOW (ref 8–27)
Bilirubin Total: 0.4 mg/dL (ref 0.0–1.2)
CO2: 21 mmol/L (ref 20–29)
Calcium: 10.1 mg/dL (ref 8.7–10.3)
Chloride: 108 mmol/L — ABNORMAL HIGH (ref 96–106)
Creatinine, Ser: 0.63 mg/dL (ref 0.57–1.00)
Globulin, Total: 2.5 g/dL (ref 1.5–4.5)
Glucose: 87 mg/dL (ref 70–99)
Potassium: 4.1 mmol/L (ref 3.5–5.2)
Sodium: 142 mmol/L (ref 134–144)
Total Protein: 6.5 g/dL (ref 6.0–8.5)
eGFR: 95 mL/min/{1.73_m2} (ref >59)

## 2023-08-20 LAB — LIPID PANEL
Chol/HDL Ratio: 2 ratio (ref 0.0–4.4)
Cholesterol, Total: 112 mg/dL (ref 100–199)
HDL: 57 mg/dL (ref >39)
LDL Chol Calc (NIH): 44 mg/dL (ref 0–99)
Triglycerides: 41 mg/dL (ref 0–149)
VLDL Cholesterol Cal: 11 mg/dL (ref 5–40)

## 2023-08-30 ENCOUNTER — Encounter: Payer: Self-pay | Admitting: Cardiovascular Disease

## 2023-09-11 ENCOUNTER — Ambulatory Visit: Payer: Self-pay

## 2023-09-11 DIAGNOSIS — Z0000 Encounter for general adult medical examination without abnormal findings: Secondary | ICD-10-CM

## 2023-09-11 NOTE — Patient Instructions (Addendum)
 [name] , Thank you for taking time to come for your Medicare Wellness Visit. I appreciate your ongoing commitment to your health goals. Please review the following plan we discussed and let me know if I can assist you in the future.   Referrals/Orders/Follow-Ups/Clinician Recommendations: NONE  This is a list of the screening recommended for you and due dates:  Health Maintenance  Topic Date Due   COVID-19 Vaccine (1) Never done   Hepatitis C Screening  Never done   Pneumonia Vaccine (1 of 2 - PCV) Never done   Zoster (Shingles) Vaccine (1 of 2) Never done   Colon Cancer Screening  Never done   Screening for Lung Cancer  Never done   Mammogram  Never done   DEXA scan (bone density measurement)  Never done   DTaP/Tdap/Td vaccine (3 - Td or Tdap) 02/01/2021   Flu Shot  12/06/2023   Medicare Annual Wellness Visit  09/10/2024   HPV Vaccine  Aged Out   Meningitis B Vaccine  Aged Out    Advanced directives: (ACP Link)Information on Advanced Care Planning can be found at [employer] Advance Health Care Directives Advance Health Care Directives. [email or web address]   Next Medicare Annual Wellness Visit scheduled for next year: Yes  09/22/24 @ 8:10 AM BY PHONE  Have you seen your provider in the last 6 months (3 months if uncontrolled diabetes)? Yes

## 2023-09-11 NOTE — Progress Notes (Signed)
 Subjective:   Jasmine [name] is a 73 y.o. who presents for a Medicare Wellness preventive visit.  Visit Complete: Virtual I connected with  Jasmine [name] on 09/11/23 by a audio enabled telemedicine application and verified that I am speaking with the correct person using two identifiers.  Patient Location: Home  Provider Location: Home Office  I discussed the limitations of evaluation and management by telemedicine. The patient expressed understanding and agreed to proceed.  Vital Signs: Because this visit was a virtual/telehealth visit, some criteria may be missing or patient reported. Any vitals not documented were not able to be obtained and vitals that have been documented are patient reported.  VideoDeclined- This patient declined Librarian, academic. Therefore the visit was completed with audio only.  Persons Participating in Visit: Patient.  AWV Questionnaire: No: Patient Medicare AWV questionnaire was not completed prior to this visit.  Cardiac Risk Factors include: advanced age (>46men, >71 women);hypertension;dyslipidemia;smoking/ tobacco exposure     Objective:    There were no vitals filed for this visit. There is no height or weight on file to calculate BMI.     09/11/2023    8:57 AM 09/10/2022    9:33 AM 09/05/2021    9:31 AM 07/04/2020    3:35 PM 08/03/2019    4:17 PM 07/17/2019    4:00 PM 07/04/2018    6:32 PM  Advanced Directives  Does Patient Have a Medical Advance Directive? No No No Yes No No No  Type of Theme park manager;Living will     Copy of Healthcare Power of Attorney in Chart?    No - copy requested     Would patient like information on creating a medical advance directive? No - Patient declined  No - Patient declined  No - Patient declined No - Patient declined No - Patient declined    Current Medications (verified) Outpatient Encounter Medications as of 09/11/2023  Medication Sig    amLODipine  (NORVASC ) 10 MG tablet TAKE 1 TABLET(10 MG) BY MOUTH DAILY.   aspirin  81 MG EC tablet Take 1 tablet (81 mg total) by mouth daily.   atorvastatin  (LIPITOR ) 80 MG tablet TAKE 1 TABLET(80 MG) BY MOUTH DAILY AT 6 PM.   hydrALAZINE  (APRESOLINE ) 50 MG tablet Take 1 tablet (50 mg) by mouth three times a day.   losartan  (COZAAR ) 50 MG tablet Take 1 tablet (50 mg total) by mouth daily.   Probiotic Product (PROBIOTIC-10 ULTIMATE) CAPS Take 1 capsule by mouth daily.   No facility-administered encounter medications on file as of 09/11/2023.    Allergies (verified) Patient has no known allergies.   History: Past Medical History:  Diagnosis Date   Basal ganglia stroke (HCC)    Hyperlipidemia    Hypertension    Hypertensive emergency    Left wrist fracture    Past Surgical History:  Procedure Laterality Date   TUBAL LIGATION  1981   Family History  Problem Relation Age of Onset   Cancer Mother    Cancer Father    Cancer Sister        throat cancer   Social History   Socioeconomic History   Marital status: Married    Spouse name: Not on file   Number of children: 2   Years of education: Not on file   Highest education level: GED or equivalent  Occupational History   Occupation: homemaker  Tobacco Use   Smoking status: Light Smoker  Current packs/day: 0.50    Average packs/day: 0.5 packs/day for 55.0 years (27.5 ttl pk-yrs)    Types: Cigarettes   Smokeless tobacco: Never  Vaping Use   Vaping status: Never Used  Substance and Sexual Activity   Alcohol use: Not Currently   Drug use: Not Currently   Sexual activity: Not Currently  Other Topics Concern   Not on file  Social History Narrative   Not on file   Social Drivers of Health   Financial Resource Strain: Low Risk  (09/11/2023)   Overall Financial Resource Strain (CARDIA)    Difficulty of Paying Living Expenses: Not very hard  Food Insecurity: No Food Insecurity (09/11/2023)   Hunger Vital Sign    Worried  About Running Out of Food in the Last Year: Never true    Ran Out of Food in the Last Year: Never true  Transportation Needs: No Transportation Needs (09/11/2023)   PRAPARE - Administrator, Civil Service (Medical): No    Lack of Transportation (Non-Medical): No  Physical Activity: Insufficiently Active (09/11/2023)   Exercise Vital Sign    Days of Exercise per Week: 4 days    Minutes of Exercise per Session: 30 min  Stress: No Stress Concern Present (09/11/2023)   Jasmine [name] of Occupational Health - Occupational Stress Questionnaire    Feeling of Stress : Only a little  Social Connections: Moderately Isolated (09/11/2023)   Social Connection and Isolation Panel [NHANES]    Frequency of Communication with Friends and Family: More than three times a week    Frequency of Social Gatherings with Friends and Family: More than three times a week    Attends Religious Services: Never    Database administrator or Organizations: No    Attends Engineer, structural: Never    Marital Status: Married    Tobacco Counseling Ready to quit: Not Answered Counseling given: Not Answered    Clinical Intake:  Pre-visit preparation completed: Yes  Pain : No/denies pain     BMI - recorded: 16.2 Nutritional Status: BMI <19  Underweight Nutritional Risks: None Diabetes: No  Lab Results  Component Value Date   HGBA1C 5.3 07/19/2019     How often do you need to have someone help you when you read instructions, pamphlets, or other written materials from your doctor or pharmacy?: 1 - Never  Interpreter Needed?: No  Information entered by :: Jasmine Fergusson, LPN   Activities of Daily Living    09/11/2023    8:58 AM  In your present state of health, do you have any difficulty performing the following activities:  Hearing? 0  Vision? 0  Difficulty concentrating or making decisions? 0  Walking or climbing stairs? 1  Comment IF HAS KNEE PAIN  Dressing or bathing? 0  Doing  errands, shopping? 0  Preparing Food and eating ? N  Using the Toilet? N  In the past six months, have you accidently leaked urine? N  Do you have problems with loss of bowel control? N  Managing your Medications? N  Managing your Finances? N  Housekeeping or managing your Housekeeping? N    Patient [name] Team: Jasmine Speed, MD as PCP - General (Family Medicine) Jasmine Fonder, MD as Consulting Physician (Cardiology) Jasmine [name], Jasmine [name] [facility])  Indicate any recent Medical Services you may have received from other than [facility] providers in the past year (date may be approximate).     Assessment:   This is a  routine wellness examination for Jasmine [name].  Hearing/Vision screen Hearing Screening - Comments:: NO AIDS Vision Screening - Comments:: WEARS GLASSES ALL THE TIME- Jasmine [name]   Goals Addressed             This Visit's Progress    DIET - INCREASE WATER INTAKE         Depression Screen     09/11/2023    8:56 AM 09/10/2022    9:30 AM 09/05/2021    9:30 AM 07/04/2020    3:33 PM 08/05/2019    9:58 AM  PHQ 2/9 Scores  PHQ - 2 Score 0 0 0 0 2  PHQ- 9 Score 0    11    Fall Risk     09/11/2023    8:58 AM 09/09/2022    8:38 PM 09/05/2021    9:32 AM 09/04/2021    8:56 PM 07/04/2020    3:35 PM  Fall Risk   Falls in the past year? 0 0 0 0 0  Number falls in past yr: 0 0 0 0 0  Injury with Fall? 0 0 0 0 0  Risk for fall due to : No Fall Risks  No Fall Risks    Follow up Falls prevention discussed;Falls evaluation completed  Falls evaluation completed      MEDICARE RISK AT HOME:  Medicare Risk at Home Any stairs in or around the home?: Yes If so, are there any without handrails?: No Home free of loose throw rugs in walkways, pet beds, electrical cords, etc?: Yes Adequate lighting in your home to reduce risk of falls?: Yes Life alert?: No Use of a cane, walker or w/c?: No Grab bars in the bathroom?: Yes Shower chair or bench in shower?: Yes Elevated toilet seat  or a handicapped toilet?: No  TIMED UP AND GO:  Was the test performed?  No  Cognitive Function: 6CIT completed        09/11/2023    9:01 AM 09/10/2022    9:35 AM 09/05/2021    9:34 AM  6CIT Screen  What Year? 0 points 0 points 0 points  What month? 0 points 0 points 0 points  What time? 0 points 0 points 0 points  Count back from 20 0 points 0 points 0 points  Months in reverse 0 points 0 points 0 points  Repeat phrase 0 points 0 points 0 points  Total Score 0 points 0 points 0 points    Immunizations Immunization History  Administered Date(s) Administered   Influenza, High Dose Seasonal PF 01/06/2018   Td 02/02/2011   Tdap 02/02/2011    Screening Tests Health Maintenance  Topic Date Due   COVID-19 Vaccine (1) Never done   Hepatitis C Screening  Never done   Pneumonia Vaccine 18+ Years old (1 of 2 - PCV) Never done   Zoster Vaccines- Shingrix (1 of 2) Never done   Colonoscopy  Never done   Lung Cancer Screening  Never done   MAMMOGRAM  Never done   DEXA SCAN  Never done   DTaP/Tdap/Td (3 - Td or Tdap) 02/01/2021   INFLUENZA VACCINE  12/06/2023   Medicare Annual Wellness (AWV)  09/10/2024   HPV VACCINES  Aged Out   Meningococcal B Vaccine  Aged Out    Health Maintenance  Health Maintenance Due  Topic Date Due   COVID-19 Vaccine (1) Never done   Hepatitis C Screening  Never done   Pneumonia Vaccine 10+ Years old (1 of 2 -  PCV) Never done   Zoster Vaccines- Shingrix (1 of 2) Never done   Colonoscopy  Never done   Lung Cancer Screening  Never done   MAMMOGRAM  Never done   DEXA SCAN  Never done   DTaP/Tdap/Td (3 - Td or Tdap) 02/01/2021   Health Maintenance Items Addressed: DECLINES ALL SHOTS; DECLINES ALL TESTING  Additional Screening:  Vision Screening: Recommended annual ophthalmology exams for early detection of glaucoma and other disorders of the [name].  Dental Screening: Recommended annual dental exams for proper oral hygiene  Community Resource  Referral / Chronic [name] Management: CRR required this visit?  No   CCM required this visit?  No     Plan:     I have personally reviewed and noted the following in the patient's chart:   Medical and social history Use of alcohol, tobacco or illicit drugs  Current medications and supplements including opioid prescriptions. Patient is not currently taking opioid prescriptions. Functional ability and status Nutritional status Physical activity Advanced directives List of other physicians Hospitalizations, surgeries, and ER visits in previous 12 months Vitals Screenings to include cognitive, depression, and falls Referrals and appointments  In addition, I have reviewed and discussed with patient certain preventive protocols, quality metrics, and best practice recommendations. A written personalized [name] plan for preventive services as well as general preventive health recommendations were provided to patient.     Pinky Bright, LPN   08/11/8293   After Visit Summary: (MyChart) Due to this being a telephonic visit, the after visit summary with patients personalized plan was offered to patient via MyChart   Notes: Nothing significant to report at this time.

## 2024-02-17 NOTE — Progress Notes (Signed)
 Jasmine [name]                                          MRN: [id number]   02/17/2024   The VBCI Quality Team Specialist reviewed this patient medical record for the purposes of chart review for care gap closure. The following were reviewed: chart review for care gap closure-colorectal cancer screening.    VBCI Quality Team

## 2024-04-26 ENCOUNTER — Telehealth: Admitting: Family Medicine

## 2024-04-26 DIAGNOSIS — J111 Influenza due to unidentified influenza virus with other respiratory manifestations: Secondary | ICD-10-CM | POA: Diagnosis not present

## 2024-04-26 MED ORDER — OSELTAMIVIR PHOSPHATE 75 MG PO CAPS: 75.0000 mg | ORAL_CAPSULE | Freq: Two times a day (BID) | ORAL | 0 refills | Status: AC

## 2024-04-26 NOTE — Progress Notes (Signed)
 E visit for Flu like symptoms   We are sorry that you are not feeling well.  Here is how we plan to help! Based on what you have shared with me it looks like you may have a respiratory virus that may be influenza.  Influenza or "the flu" is  an infection caused by a respiratory virus. The flu virus is highly contagious and persons who did not receive their yearly flu vaccination may "catch" the flu from close contact.  We have anti-viral medications to treat the viruses that cause this infection. They are not a "cure" and only shorten the course of the infection. These prescriptions are most effective when they are given within the first 2 days of "flu" symptoms. Antiviral medications are indicated if you have a high risk of complications from the flu. You should  also consider an antiviral medication if you are in close contact with someone who is at risk. These medications can help patients avoid complications from the flu but have side effects that you should know.   Possible side effects from Tamiflu  or oseltamivir  include nausea, vomiting, diarrhea, dizziness, headaches, eye redness, sleep problems or other respiratory symptoms. You should not take Tamiflu  if you have an allergy to oseltamivir  or any to the ingredients in Tamiflu .  Based upon your symptoms and potential risk factors I have prescribed Oseltamivir  (Tamiflu ).  It has been sent to your designated pharmacy.  You will take one 75 mg capsule orally twice a day for the next 5 days.   For nasal congestion, you may use an oral decongestant such as Mucinex D or if you have glaucoma or high blood pressure use plain Mucinex.  Saline nasal spray or nasal drops can help and can safely be used as often as needed for congestion.  If you have a sore or scratchy throat, use a saltwater gargle-  to  teaspoon of salt dissolved in a 4-ounce to 8-ounce glass of warm water.  Gargle the solution for approximately 15-30 seconds and then spit.  It is  important not to swallow the solution.  You can also use throat lozenges/cough drops and Chloraseptic spray to help with throat pain or discomfort.  Warm or cold liquids can also be helpful in relieving throat pain.  For headache, pain or general discomfort, you can use Ibuprofen or Tylenol as directed.   Some authorities believe that zinc sprays or the use of Echinacea may shorten the course of your symptoms. You are to isolate at home until you have been fever-free for at least 24 hours without a fever-reducing medication, and symptoms have been steadily improving for 24 hours.  If you must be around other household members who do not have symptoms, you need to make sure that both you and the family members are masking consistently with a high-quality mask.  If you note any worsening of symptoms despite treatment, please seek an in-person evaluation ASAP. If you note any significant shortness of breath or any chest pain, please seek ED evaluation. Please do not delay care!  ANYONE WHO HAS FLU SYMPTOMS SHOULD: Stay home. The flu is highly contagious and going out or to work exposes others! Be sure to drink plenty of fluids. Water is fine as well as fruit juices, sodas and electrolyte beverages. You may want to stay away from caffeine or alcohol. If you are nauseated, try taking small sips of liquids. How do you know if you are getting enough fluid? Your urine should be a pale  yellow or almost colorless. Get rest. Taking a steamy shower or using a humidifier may help nasal congestion and ease sore throat pain. Using a saline nasal spray works much the same way. Cough drops, hard candies and sore throat lozenges may ease your cough. Line up a caregiver. Have someone check on you regularly.  GET HELP RIGHT AWAY IF: You cannot keep down liquids or your medications. You become short of breath Your fell like you are going to pass out or loose consciousness. Your symptoms persist after you have  completed your treatment plan  MAKE SURE YOU  Understand these instructions. Will watch your condition. Will get help right away if you are not doing well or get worse.  Your e-visit answers were reviewed by a board certified advanced clinical practitioner to complete your personal care plan.  Depending on the condition, your plan could have included both over the counter or prescription medications.  If there is a problem please reply  once you have received a response from your provider.  Your safety is important to us .  If you have drug allergies check your prescription carefully.    You can use MyChart to ask questions about today's visit, request a non-urgent call back, or ask for a work or school excuse for 24 hours related to this e-Visit. If it has been greater than 24 hours you will need to follow up with your provider, or enter a new e-Visit to address those concerns.  You will get an e-mail in the next two days asking about your experience.  I hope that your e-visit has been valuable and will speed your recovery. Thank you for using e-visits.   I have spent 5 minutes in review of e-visit questionnaire, review and updating patient chart, medical decision making and response to patient.   Karmina Zufall, FNP

## 2024-05-22 ENCOUNTER — Other Ambulatory Visit: Payer: Self-pay

## 2024-05-22 MED ORDER — LOSARTAN POTASSIUM 50 MG PO TABS: 50.0000 mg | ORAL_TABLET | Freq: Every day | ORAL | 3 refills | Status: AC

## 2024-09-22 ENCOUNTER — Ambulatory Visit
